# Patient Record
Sex: Female | Born: 1960 | Race: White | Hispanic: No | Marital: Married | Smoking: Never smoker
Health system: Southern US, Community
[De-identification: ages and names within clinical notes are randomized; demographics above are authoritative.]

## PROBLEM LIST (undated history)

## (undated) DIAGNOSIS — F419 Anxiety disorder, unspecified: Secondary | ICD-10-CM

## (undated) DIAGNOSIS — C439 Malignant melanoma of skin, unspecified: Secondary | ICD-10-CM

## (undated) DIAGNOSIS — R011 Cardiac murmur, unspecified: Secondary | ICD-10-CM

## (undated) DIAGNOSIS — D649 Anemia, unspecified: Secondary | ICD-10-CM

## (undated) DIAGNOSIS — O139 Gestational [pregnancy-induced] hypertension without significant proteinuria, unspecified trimester: Secondary | ICD-10-CM

## (undated) DIAGNOSIS — T7840XA Allergy, unspecified, initial encounter: Secondary | ICD-10-CM

## (undated) DIAGNOSIS — K219 Gastro-esophageal reflux disease without esophagitis: Secondary | ICD-10-CM

## (undated) DIAGNOSIS — K5790 Diverticulosis of intestine, part unspecified, without perforation or abscess without bleeding: Secondary | ICD-10-CM

## (undated) DIAGNOSIS — E785 Hyperlipidemia, unspecified: Secondary | ICD-10-CM

## (undated) HISTORY — DX: Allergy, unspecified, initial encounter: T78.40XA

## (undated) HISTORY — DX: Anxiety disorder, unspecified: F41.9

## (undated) HISTORY — DX: Hyperlipidemia, unspecified: E78.5

## (undated) HISTORY — DX: Anemia, unspecified: D64.9

## (undated) HISTORY — PX: COLONOSCOPY: SHX174

## (undated) HISTORY — PX: OTHER SURGICAL HISTORY: SHX169

## (undated) HISTORY — DX: Malignant melanoma of skin, unspecified: C43.9

## (undated) HISTORY — DX: Diverticulosis of intestine, part unspecified, without perforation or abscess without bleeding: K57.90

## (undated) HISTORY — PX: CHOLECYSTECTOMY: SHX55

## (undated) HISTORY — DX: Cardiac murmur, unspecified: R01.1

## (undated) HISTORY — DX: Gastro-esophageal reflux disease without esophagitis: K21.9

## (undated) HISTORY — DX: Gestational (pregnancy-induced) hypertension without significant proteinuria, unspecified trimester: O13.9

---

## 2001-02-18 ENCOUNTER — Other Ambulatory Visit: Admission: RE | Admit: 2001-02-18 | Discharge: 2001-02-18 | Payer: Self-pay | Admitting: Obstetrics and Gynecology

## 2001-12-12 ENCOUNTER — Encounter: Admission: RE | Admit: 2001-12-12 | Discharge: 2001-12-12 | Payer: Self-pay | Admitting: Internal Medicine

## 2001-12-12 ENCOUNTER — Encounter: Payer: Self-pay | Admitting: Internal Medicine

## 2003-01-25 ENCOUNTER — Encounter: Payer: Self-pay | Admitting: Internal Medicine

## 2003-01-25 ENCOUNTER — Encounter: Admission: RE | Admit: 2003-01-25 | Discharge: 2003-01-25 | Payer: Self-pay | Admitting: Internal Medicine

## 2004-05-05 ENCOUNTER — Encounter: Admission: RE | Admit: 2004-05-05 | Discharge: 2004-05-05 | Payer: Self-pay | Admitting: Internal Medicine

## 2004-06-16 ENCOUNTER — Ambulatory Visit: Payer: Self-pay | Admitting: Internal Medicine

## 2005-09-07 ENCOUNTER — Encounter: Admission: RE | Admit: 2005-09-07 | Discharge: 2005-09-07 | Payer: Self-pay | Admitting: Internal Medicine

## 2005-09-14 ENCOUNTER — Ambulatory Visit: Payer: Self-pay | Admitting: Internal Medicine

## 2006-09-13 ENCOUNTER — Encounter: Admission: RE | Admit: 2006-09-13 | Discharge: 2006-09-13 | Payer: Self-pay | Admitting: Internal Medicine

## 2006-09-25 ENCOUNTER — Encounter: Admission: RE | Admit: 2006-09-25 | Discharge: 2006-09-25 | Payer: Self-pay | Admitting: Internal Medicine

## 2006-12-09 DIAGNOSIS — R011 Cardiac murmur, unspecified: Secondary | ICD-10-CM

## 2006-12-09 DIAGNOSIS — I10 Essential (primary) hypertension: Secondary | ICD-10-CM

## 2006-12-09 DIAGNOSIS — G5600 Carpal tunnel syndrome, unspecified upper limb: Secondary | ICD-10-CM

## 2007-03-21 ENCOUNTER — Other Ambulatory Visit: Admission: RE | Admit: 2007-03-21 | Discharge: 2007-03-21 | Payer: Self-pay | Admitting: Family Medicine

## 2007-03-21 ENCOUNTER — Encounter: Payer: Self-pay | Admitting: Family Medicine

## 2007-03-21 ENCOUNTER — Ambulatory Visit: Payer: Self-pay | Admitting: Family Medicine

## 2007-03-21 DIAGNOSIS — R82998 Other abnormal findings in urine: Secondary | ICD-10-CM

## 2007-03-21 DIAGNOSIS — C4359 Malignant melanoma of other part of trunk: Secondary | ICD-10-CM

## 2007-03-21 DIAGNOSIS — N943 Premenstrual tension syndrome: Secondary | ICD-10-CM | POA: Insufficient documentation

## 2007-03-21 LAB — CONVERTED CEMR LAB: Pap Smear: NORMAL

## 2007-03-22 ENCOUNTER — Encounter: Payer: Self-pay | Admitting: Family Medicine

## 2007-03-25 ENCOUNTER — Encounter (INDEPENDENT_AMBULATORY_CARE_PROVIDER_SITE_OTHER): Payer: Self-pay | Admitting: *Deleted

## 2007-03-26 ENCOUNTER — Encounter (INDEPENDENT_AMBULATORY_CARE_PROVIDER_SITE_OTHER): Payer: Self-pay | Admitting: *Deleted

## 2007-04-02 ENCOUNTER — Telehealth (INDEPENDENT_AMBULATORY_CARE_PROVIDER_SITE_OTHER): Payer: Self-pay | Admitting: *Deleted

## 2007-04-02 DIAGNOSIS — D239 Other benign neoplasm of skin, unspecified: Secondary | ICD-10-CM | POA: Insufficient documentation

## 2007-04-08 LAB — CONVERTED CEMR LAB: Pap Smear: NORMAL

## 2007-07-18 ENCOUNTER — Ambulatory Visit: Payer: Self-pay | Admitting: Family Medicine

## 2007-07-21 LAB — CONVERTED CEMR LAB
ALT: 18 units/L (ref 0–35)
AST: 18 units/L (ref 0–37)
Albumin: 3.9 g/dL (ref 3.5–5.2)
Alkaline Phosphatase: 53 units/L (ref 39–117)
Bilirubin, Direct: 0.1 mg/dL (ref 0.0–0.3)
Cholesterol: 222 mg/dL (ref 0–200)
Direct LDL: 161.7 mg/dL
HDL: 48.7 mg/dL (ref >39.0)
Total Bilirubin: 0.7 mg/dL (ref 0.3–1.2)
Total CHOL/HDL Ratio: 4.6
Total Protein: 6.8 g/dL (ref 6.0–8.3)
Triglycerides: 65 mg/dL (ref 0–149)
VLDL: 13 mg/dL (ref 0–40)

## 2007-08-08 ENCOUNTER — Telehealth (INDEPENDENT_AMBULATORY_CARE_PROVIDER_SITE_OTHER): Payer: Self-pay | Admitting: *Deleted

## 2007-09-16 ENCOUNTER — Encounter: Admission: RE | Admit: 2007-09-16 | Discharge: 2007-09-16 | Payer: Self-pay | Admitting: Family Medicine

## 2007-09-22 ENCOUNTER — Encounter (INDEPENDENT_AMBULATORY_CARE_PROVIDER_SITE_OTHER): Payer: Self-pay | Admitting: *Deleted

## 2007-10-10 ENCOUNTER — Ambulatory Visit: Payer: Self-pay | Admitting: Family Medicine

## 2007-10-10 LAB — CONVERTED CEMR LAB
ALT: 15 units/L (ref 0–35)
AST: 16 units/L (ref 0–37)
Albumin: 3.9 g/dL (ref 3.5–5.2)
Alkaline Phosphatase: 47 units/L (ref 39–117)
Bilirubin, Direct: 0.1 mg/dL (ref 0.0–0.3)
Cholesterol: 136 mg/dL (ref 0–200)
HDL: 43.7 mg/dL (ref >39.0)
LDL Cholesterol: 82 mg/dL (ref 0–99)
Total Bilirubin: 0.8 mg/dL (ref 0.3–1.2)
Total CHOL/HDL Ratio: 3.1
Total Protein: 7 g/dL (ref 6.0–8.3)
Triglycerides: 51 mg/dL (ref 0–149)
VLDL: 10 mg/dL (ref 0–40)

## 2007-10-14 ENCOUNTER — Encounter (INDEPENDENT_AMBULATORY_CARE_PROVIDER_SITE_OTHER): Payer: Self-pay | Admitting: *Deleted

## 2008-02-04 ENCOUNTER — Ambulatory Visit: Payer: Self-pay | Admitting: *Deleted

## 2008-02-04 DIAGNOSIS — M79609 Pain in unspecified limb: Secondary | ICD-10-CM

## 2008-02-04 DIAGNOSIS — Z85820 Personal history of malignant melanoma of skin: Secondary | ICD-10-CM

## 2008-02-25 ENCOUNTER — Telehealth: Payer: Self-pay | Admitting: Family Medicine

## 2008-03-24 ENCOUNTER — Ambulatory Visit: Payer: Self-pay | Admitting: Family Medicine

## 2008-03-24 DIAGNOSIS — R109 Unspecified abdominal pain: Secondary | ICD-10-CM

## 2008-03-25 ENCOUNTER — Encounter (INDEPENDENT_AMBULATORY_CARE_PROVIDER_SITE_OTHER): Payer: Self-pay | Admitting: *Deleted

## 2008-03-26 ENCOUNTER — Encounter: Admission: RE | Admit: 2008-03-26 | Discharge: 2008-03-26 | Payer: Self-pay | Admitting: Family Medicine

## 2008-03-26 DIAGNOSIS — Z8719 Personal history of other diseases of the digestive system: Secondary | ICD-10-CM

## 2008-03-29 ENCOUNTER — Telehealth: Payer: Self-pay | Admitting: Family Medicine

## 2008-03-29 ENCOUNTER — Telehealth (INDEPENDENT_AMBULATORY_CARE_PROVIDER_SITE_OTHER): Payer: Self-pay | Admitting: *Deleted

## 2008-03-29 ENCOUNTER — Encounter (INDEPENDENT_AMBULATORY_CARE_PROVIDER_SITE_OTHER): Payer: Self-pay | Admitting: *Deleted

## 2008-04-30 ENCOUNTER — Ambulatory Visit: Payer: Self-pay | Admitting: Family Medicine

## 2008-04-30 DIAGNOSIS — H1030 Unspecified acute conjunctivitis, unspecified eye: Secondary | ICD-10-CM | POA: Insufficient documentation

## 2008-04-30 DIAGNOSIS — S058X9A Other injuries of unspecified eye and orbit, initial encounter: Secondary | ICD-10-CM | POA: Insufficient documentation

## 2008-05-10 ENCOUNTER — Encounter: Payer: Self-pay | Admitting: Family Medicine

## 2008-05-11 ENCOUNTER — Ambulatory Visit: Payer: Self-pay | Admitting: Family Medicine

## 2008-05-11 ENCOUNTER — Encounter: Payer: Self-pay | Admitting: Family Medicine

## 2008-05-11 ENCOUNTER — Other Ambulatory Visit: Admission: RE | Admit: 2008-05-11 | Discharge: 2008-05-11 | Payer: Self-pay | Admitting: Family Medicine

## 2008-05-18 ENCOUNTER — Encounter (INDEPENDENT_AMBULATORY_CARE_PROVIDER_SITE_OTHER): Payer: Self-pay | Admitting: *Deleted

## 2008-05-24 ENCOUNTER — Ambulatory Visit: Payer: Self-pay | Admitting: Family Medicine

## 2008-05-24 ENCOUNTER — Encounter (INDEPENDENT_AMBULATORY_CARE_PROVIDER_SITE_OTHER): Payer: Self-pay | Admitting: *Deleted

## 2008-05-24 LAB — CONVERTED CEMR LAB
OCCULT 1: NEGATIVE
OCCULT 2: NEGATIVE
OCCULT 3: NEGATIVE

## 2008-05-28 ENCOUNTER — Ambulatory Visit: Payer: Self-pay | Admitting: Family Medicine

## 2008-06-09 ENCOUNTER — Encounter (INDEPENDENT_AMBULATORY_CARE_PROVIDER_SITE_OTHER): Payer: Self-pay | Admitting: Surgery

## 2008-06-09 ENCOUNTER — Ambulatory Visit (HOSPITAL_COMMUNITY): Admission: RE | Admit: 2008-06-09 | Discharge: 2008-06-09 | Payer: Self-pay | Admitting: Surgery

## 2008-06-19 LAB — CONVERTED CEMR LAB
ALT: 18 units/L (ref 0–35)
AST: 24 units/L (ref 0–37)
Albumin: 3.9 g/dL (ref 3.5–5.2)
Alkaline Phosphatase: 55 units/L (ref 39–117)
BUN: 14 mg/dL (ref 6–23)
Basophils Absolute: 0.1 10*3/uL (ref 0.0–0.1)
Basophils Relative: 1.1 % (ref 0.0–3.0)
Bilirubin, Direct: 0.1 mg/dL (ref 0.0–0.3)
CO2: 29 meq/L (ref 19–32)
Calcium: 9 mg/dL (ref 8.4–10.5)
Chloride: 105 meq/L (ref 96–112)
Cholesterol: 186 mg/dL (ref 0–200)
Creatinine, Ser: 0.9 mg/dL (ref 0.4–1.2)
Eosinophils Absolute: 0.5 10*3/uL (ref 0.0–0.7)
Eosinophils Relative: 9.4 % — ABNORMAL HIGH (ref 0.0–5.0)
GFR calc Af Amer: 86 mL/min
GFR calc non Af Amer: 71 mL/min
Glucose, Bld: 75 mg/dL (ref 70–99)
HCT: 37.9 % (ref 36.0–46.0)
HDL: 47 mg/dL (ref >39.0)
Hemoglobin: 13.2 g/dL (ref 12.0–15.0)
LDL Cholesterol: 119 mg/dL — ABNORMAL HIGH (ref 0–99)
Lymphocytes Relative: 35.7 % (ref 12.0–46.0)
MCHC: 34.8 g/dL (ref 30.0–36.0)
MCV: 93.2 fL (ref 78.0–100.0)
Monocytes Absolute: 0.4 10*3/uL (ref 0.1–1.0)
Monocytes Relative: 7.3 % (ref 3.0–12.0)
Neutro Abs: 2.5 10*3/uL (ref 1.4–7.7)
Neutrophils Relative %: 46.5 % (ref 43.0–77.0)
Platelets: 235 10*3/uL (ref 150–400)
Potassium: 4.1 meq/L (ref 3.5–5.1)
RBC: 4.07 M/uL (ref 3.87–5.11)
RDW: 12.6 % (ref 11.5–14.6)
Sodium: 139 meq/L (ref 135–145)
TSH: 1.64 microintl units/mL (ref 0.35–5.50)
Total Bilirubin: 0.8 mg/dL (ref 0.3–1.2)
Total CHOL/HDL Ratio: 4
Total Protein: 6.9 g/dL (ref 6.0–8.3)
Triglycerides: 101 mg/dL (ref 0–149)
VLDL: 20 mg/dL (ref 0–40)
WBC: 5.5 10*3/uL (ref 4.5–10.5)

## 2008-06-22 ENCOUNTER — Telehealth (INDEPENDENT_AMBULATORY_CARE_PROVIDER_SITE_OTHER): Payer: Self-pay | Admitting: *Deleted

## 2008-09-16 ENCOUNTER — Encounter: Admission: RE | Admit: 2008-09-16 | Discharge: 2008-09-16 | Payer: Self-pay | Admitting: Family Medicine

## 2008-09-20 ENCOUNTER — Telehealth (INDEPENDENT_AMBULATORY_CARE_PROVIDER_SITE_OTHER): Payer: Self-pay | Admitting: *Deleted

## 2008-09-27 ENCOUNTER — Telehealth: Payer: Self-pay | Admitting: Family Medicine

## 2008-10-05 ENCOUNTER — Telehealth (INDEPENDENT_AMBULATORY_CARE_PROVIDER_SITE_OTHER): Payer: Self-pay | Admitting: *Deleted

## 2008-10-07 ENCOUNTER — Encounter: Payer: Self-pay | Admitting: Family Medicine

## 2009-05-20 ENCOUNTER — Ambulatory Visit: Payer: Self-pay | Admitting: Family Medicine

## 2009-05-20 ENCOUNTER — Other Ambulatory Visit: Admission: RE | Admit: 2009-05-20 | Discharge: 2009-05-20 | Payer: Self-pay | Admitting: Family Medicine

## 2009-05-20 ENCOUNTER — Encounter: Payer: Self-pay | Admitting: Family Medicine

## 2009-05-20 DIAGNOSIS — F411 Generalized anxiety disorder: Secondary | ICD-10-CM

## 2009-05-27 ENCOUNTER — Encounter: Payer: Self-pay | Admitting: Family Medicine

## 2009-06-01 ENCOUNTER — Telehealth (INDEPENDENT_AMBULATORY_CARE_PROVIDER_SITE_OTHER): Payer: Self-pay | Admitting: *Deleted

## 2009-09-19 ENCOUNTER — Encounter: Admission: RE | Admit: 2009-09-19 | Discharge: 2009-09-19 | Payer: Self-pay | Admitting: Family Medicine

## 2009-11-08 ENCOUNTER — Telehealth: Payer: Self-pay | Admitting: Family Medicine

## 2010-02-14 ENCOUNTER — Ambulatory Visit: Payer: Self-pay | Admitting: Family Medicine

## 2010-03-07 ENCOUNTER — Telehealth: Payer: Self-pay | Admitting: Family Medicine

## 2010-03-14 ENCOUNTER — Telehealth (INDEPENDENT_AMBULATORY_CARE_PROVIDER_SITE_OTHER): Payer: Self-pay | Admitting: *Deleted

## 2010-05-19 ENCOUNTER — Ambulatory Visit: Payer: Self-pay | Admitting: Family Medicine

## 2010-05-23 ENCOUNTER — Other Ambulatory Visit: Admission: RE | Admit: 2010-05-23 | Discharge: 2010-05-23 | Payer: Self-pay | Admitting: Family Medicine

## 2010-05-23 ENCOUNTER — Ambulatory Visit: Payer: Self-pay | Admitting: Family Medicine

## 2010-05-23 ENCOUNTER — Encounter: Payer: Self-pay | Admitting: Family Medicine

## 2010-05-23 DIAGNOSIS — E785 Hyperlipidemia, unspecified: Secondary | ICD-10-CM | POA: Insufficient documentation

## 2010-05-26 LAB — CONVERTED CEMR LAB: Pap Smear: NEGATIVE

## 2010-06-06 ENCOUNTER — Telehealth (INDEPENDENT_AMBULATORY_CARE_PROVIDER_SITE_OTHER): Payer: Self-pay | Admitting: *Deleted

## 2010-08-06 LAB — CONVERTED CEMR LAB
ALT: 15 units/L (ref 0–35)
ALT: 20 units/L (ref 0–35)
ALT: 28 units/L (ref 0–35)
ALT: 42 units/L — ABNORMAL HIGH (ref 0–35)
AST: 16 units/L (ref 0–37)
AST: 22 units/L (ref 0–37)
AST: 27 units/L (ref 0–37)
AST: 71 units/L — ABNORMAL HIGH (ref 0–37)
Albumin: 3.9 g/dL (ref 3.5–5.2)
Albumin: 4.1 g/dL (ref 3.5–5.2)
Albumin: 4.2 g/dL (ref 3.5–5.2)
Albumin: 4.5 g/dL (ref 3.5–5.2)
Alkaline Phosphatase: 52 units/L (ref 39–117)
Alkaline Phosphatase: 52 units/L (ref 39–117)
Alkaline Phosphatase: 60 units/L (ref 39–117)
Alkaline Phosphatase: 77 units/L (ref 39–117)
BUN: 11 mg/dL (ref 6–23)
BUN: 11 mg/dL (ref 6–23)
BUN: 13 mg/dL (ref 6–23)
BUN: 17 mg/dL (ref 6–23)
Basophils Absolute: 0 10*3/uL (ref 0.0–0.1)
Basophils Absolute: 0 10*3/uL (ref 0.0–0.1)
Basophils Absolute: 0 10*3/uL (ref 0.0–0.1)
Basophils Absolute: 0 10*3/uL (ref 0.0–0.1)
Basophils Relative: 0.2 % (ref 0.0–1.0)
Basophils Relative: 0.2 % (ref 0.0–3.0)
Basophils Relative: 0.6 % (ref 0.0–3.0)
Basophils Relative: 0.7 % (ref 0.0–3.0)
Bilirubin Urine: NEGATIVE
Bilirubin Urine: NEGATIVE
Bilirubin Urine: NEGATIVE
Bilirubin, Direct: 0 mg/dL (ref 0.0–0.3)
Bilirubin, Direct: 0.1 mg/dL (ref 0.0–0.3)
Bilirubin, Direct: 0.1 mg/dL (ref 0.0–0.3)
Bilirubin, Direct: 0.1 mg/dL (ref 0.0–0.3)
Blood in Urine, dipstick: NEGATIVE
Blood in Urine, dipstick: NEGATIVE
CO2: 27 meq/L (ref 19–32)
CO2: 28 meq/L (ref 19–32)
CO2: 28 meq/L (ref 19–32)
CO2: 28 meq/L (ref 19–32)
Calcium: 8.8 mg/dL (ref 8.4–10.5)
Calcium: 9.1 mg/dL (ref 8.4–10.5)
Calcium: 9.3 mg/dL (ref 8.4–10.5)
Calcium: 9.5 mg/dL (ref 8.4–10.5)
Chloride: 104 meq/L (ref 96–112)
Chloride: 105 meq/L (ref 96–112)
Chloride: 109 meq/L (ref 96–112)
Chloride: 110 meq/L (ref 96–112)
Cholesterol: 214 mg/dL (ref 0–200)
Cholesterol: 230 mg/dL — ABNORMAL HIGH (ref 0–200)
Creatinine, Ser: 0.8 mg/dL (ref 0.4–1.2)
Creatinine, Ser: 0.9 mg/dL (ref 0.4–1.2)
Creatinine, Ser: 0.9 mg/dL (ref 0.4–1.2)
Creatinine, Ser: 1 mg/dL (ref 0.4–1.2)
Direct LDL: 160.6 mg/dL
Direct LDL: 169.7 mg/dL
Eosinophils Absolute: 0.1 10*3/uL (ref 0.0–0.7)
Eosinophils Absolute: 0.2 10*3/uL (ref 0.0–0.7)
Eosinophils Absolute: 0.2 10*3/uL (ref 0.0–0.7)
Eosinophils Absolute: 0.3 10*3/uL (ref 0.0–0.6)
Eosinophils Relative: 1.3 % (ref 0.0–5.0)
Eosinophils Relative: 3.2 % (ref 0.0–5.0)
Eosinophils Relative: 4.4 % (ref 0.0–5.0)
Eosinophils Relative: 5 % (ref 0.0–5.0)
GFR calc Af Amer: 86 mL/min
GFR calc Af Amer: 99 mL/min
GFR calc non Af Amer: 62.77 mL/min (ref >60)
GFR calc non Af Amer: 71 mL/min
GFR calc non Af Amer: 72.45 mL/min (ref >60)
GFR calc non Af Amer: 82 mL/min
Glucose, Bld: 81 mg/dL (ref 70–99)
Glucose, Bld: 89 mg/dL (ref 70–99)
Glucose, Bld: 89 mg/dL (ref 70–99)
Glucose, Bld: 90 mg/dL (ref 70–99)
Glucose, Urine, Semiquant: NEGATIVE
Glucose, Urine, Semiquant: NEGATIVE
Glucose, Urine, Semiquant: NEGATIVE
HCT: 35.7 % — ABNORMAL LOW (ref 36.0–46.0)
HCT: 37.5 % (ref 36.0–46.0)
HCT: 38.2 % (ref 36.0–46.0)
HCT: 38.3 % (ref 36.0–46.0)
HDL: 43.4 mg/dL (ref >39.0)
HDL: 49.7 mg/dL (ref >39.00)
Hemoglobin: 12.5 g/dL (ref 12.0–15.0)
Hemoglobin: 12.8 g/dL (ref 12.0–15.0)
Hemoglobin: 13.2 g/dL (ref 12.0–15.0)
Hemoglobin: 13.4 g/dL (ref 12.0–15.0)
Hgb A1c MFr Bld: 5.7 % (ref 4.6–6.0)
Ketones, urine, test strip: NEGATIVE
Ketones, urine, test strip: NEGATIVE
Ketones, urine, test strip: NEGATIVE
Lymphocytes Relative: 13.1 % (ref 12.0–46.0)
Lymphocytes Relative: 29.8 % (ref 12.0–46.0)
Lymphocytes Relative: 34.1 % (ref 12.0–46.0)
Lymphocytes Relative: 36.3 % (ref 12.0–46.0)
Lymphs Abs: 1.6 10*3/uL (ref 0.7–4.0)
Lymphs Abs: 1.8 10*3/uL (ref 0.7–4.0)
MCHC: 34 g/dL (ref 30.0–36.0)
MCHC: 34.5 g/dL (ref 30.0–36.0)
MCHC: 35.1 g/dL (ref 30.0–36.0)
MCHC: 35.1 g/dL (ref 30.0–36.0)
MCV: 91.9 fL (ref 78.0–100.0)
MCV: 93.2 fL (ref 78.0–100.0)
MCV: 94.2 fL (ref 78.0–100.0)
MCV: 94.4 fL (ref 78.0–100.0)
Monocytes Absolute: 0.3 10*3/uL (ref 0.1–1.0)
Monocytes Absolute: 0.4 10*3/uL (ref 0.1–1.0)
Monocytes Absolute: 0.4 10*3/uL (ref 0.2–0.7)
Monocytes Absolute: 0.5 10*3/uL (ref 0.1–1.0)
Monocytes Relative: 5.2 % (ref 3.0–12.0)
Monocytes Relative: 5.8 % (ref 3.0–12.0)
Monocytes Relative: 7.3 % (ref 3.0–11.0)
Monocytes Relative: 9 % (ref 3.0–12.0)
Neutro Abs: 2.2 10*3/uL (ref 1.4–7.7)
Neutro Abs: 3 10*3/uL (ref 1.4–7.7)
Neutro Abs: 3.5 10*3/uL (ref 1.4–7.7)
Neutro Abs: 8.4 10*3/uL — ABNORMAL HIGH (ref 1.4–7.7)
Neutrophils Relative %: 49 % (ref 43.0–77.0)
Neutrophils Relative %: 56.3 % (ref 43.0–77.0)
Neutrophils Relative %: 58.3 % (ref 43.0–77.0)
Neutrophils Relative %: 80.2 % — ABNORMAL HIGH (ref 43.0–77.0)
Nitrite: NEGATIVE
Nitrite: NEGATIVE
Nitrite: NEGATIVE
Phosphorus: 3.1 mg/dL (ref 2.3–4.6)
Platelets: 270 10*3/uL (ref 150–400)
Platelets: 278 10*3/uL (ref 150–400)
Platelets: 288 10*3/uL (ref 150.0–400.0)
Platelets: 289 10*3/uL (ref 150.0–400.0)
Potassium: 3.7 meq/L (ref 3.5–5.1)
Potassium: 4 meq/L (ref 3.5–5.1)
Potassium: 4.1 meq/L (ref 3.5–5.1)
Potassium: 4.8 meq/L (ref 3.5–5.1)
Protein, U semiquant: NEGATIVE
Protein, U semiquant: NEGATIVE
Protein, U semiquant: NEGATIVE
RBC: 3.89 M/uL (ref 3.87–5.11)
RBC: 4.02 M/uL (ref 3.87–5.11)
RBC: 4.06 M/uL (ref 3.87–5.11)
RBC: 4.06 M/uL (ref 3.87–5.11)
RDW: 12.2 % (ref 11.5–14.6)
RDW: 12.7 % (ref 11.5–14.6)
RDW: 12.7 % (ref 11.5–14.6)
RDW: 13.8 % (ref 11.5–14.6)
Sodium: 137 meq/L (ref 135–145)
Sodium: 140 meq/L (ref 135–145)
Sodium: 141 meq/L (ref 135–145)
Sodium: 141 meq/L (ref 135–145)
Specific Gravity, Urine: 1.01
Specific Gravity, Urine: <1.005
Specific Gravity, Urine: <1.005
TSH: 1.22 microintl units/mL (ref 0.35–5.50)
TSH: 1.47 microintl units/mL (ref 0.35–5.50)
TSH: 1.57 microintl units/mL (ref 0.35–5.50)
TSH: 1.84 microintl units/mL (ref 0.35–5.50)
Total Bilirubin: 0.5 mg/dL (ref 0.3–1.2)
Total Bilirubin: 0.7 mg/dL (ref 0.3–1.2)
Total Bilirubin: 0.8 mg/dL (ref 0.3–1.2)
Total Bilirubin: 0.9 mg/dL (ref 0.3–1.2)
Total CHOL/HDL Ratio: 4.9
Total CHOL/HDL Ratio: 5
Total Protein: 6.8 g/dL (ref 6.0–8.3)
Total Protein: 7 g/dL (ref 6.0–8.3)
Total Protein: 7.2 g/dL (ref 6.0–8.3)
Total Protein: 7.2 g/dL (ref 6.0–8.3)
Triglycerides: 58 mg/dL (ref 0–149)
Triglycerides: 61 mg/dL (ref 0.0–149.0)
Urobilinogen, UA: 0.2
Urobilinogen, UA: NEGATIVE
Urobilinogen, UA: NEGATIVE
VLDL: 12 mg/dL (ref 0–40)
VLDL: 12.2 mg/dL (ref 0.0–40.0)
WBC Urine, dipstick: NEGATIVE
WBC Urine, dipstick: NEGATIVE
WBC: 10.4 10*3/uL (ref 4.5–10.5)
WBC: 4.5 10*3/uL (ref 4.5–10.5)
WBC: 5.3 10*3/uL (ref 4.5–10.5)
WBC: 6 10*3/uL (ref 4.5–10.5)
pH: 5
pH: 5.5
pH: 6

## 2010-08-10 NOTE — Assessment & Plan Note (Signed)
Summary: CPX/NTA   Vital Signs:  Patient profile:   50 year old female Height:      60.75 inches Weight:      132.0 pounds BMI:     25.24 Temp:     97.3 degrees F oral Pulse rate:   72 / minute Pulse rhythm:   regular BP sitting:   120 / 80  (right arm) Cuff size:   regular  Vitals Entered By: Almeta Monas CMA Duncan Dull) (May 23, 2010 2:15 PM) CC: CPX-- Pt never started Zocor   History of Present Illness: Pt here for cpe , and pap---pt had labs last week.    No complaints.  Pt never took zocor--- she filled rx but never took pills.     Preventive Screening-Counseling & Management  Alcohol-Tobacco     Alcohol drinks/day: <1     Smoking Status: never     Passive Smoke Exposure: no  Caffeine-Diet-Exercise     Caffeine use/day: 3     Does Patient Exercise: yes     Type of exercise: walk     Exercise (avg: min/session): 30-60     Times/week: 4     Exercise Counseling: to improve exercise regimen  Hep-HIV-STD-Contraception     HIV Risk: no     Dental Visit-last 6 months yes     Dental Care Counseling: not indicated; dental care within six months     SBE monthly: yes     SBE Education/Counseling: not indicated; SBE done regularly     Sun Exposure-Excessive: occasionally  Safety-Violence-Falls     Seat Belt Use: yes      Sexual History:  currently monogamous.        Drug Use:  no.    Current Medications (verified): 1)  Lorazepam 1 Mg  Tabs (Lorazepam) .Marland Kitchen.. 1 By Mouth Once Daily As Needed 2)  Claritin-D 12 Hour 5-120 Mg  Tb12 (Loratadine-Pseudoephedrine) .Marland Kitchen.. 1 By Mouth Once Daily 3)  Relpax 40 Mg  Tabs (Eletriptan Hydrobromide) .... As Needed Migraines  Allergies (verified): No Known Drug Allergies  Past History:  Past Medical History: Last updated: 02/04/2008 gestational htn melanoma-- back asthma as a child seasonal allergies heart murmur hyperlipidemia  Past Surgical History: Last updated: 05/11/2008 skin surgery for moles, melanoma nasal  polyps  Family History: Last updated: 12/09/2006 Family History Diabetes 1st degree relative Family History Hypertension Family History Lung cancer Family History Other cancer-Brain Family History MI Family History Stroke M Great Aunt- form of Lou Gerig's dz  Social History: Last updated: 02/04/2008 Never Smoked Alcohol use-no Occupation: church Manufacturing systems engineer Married 1 child Drug use-no Regular exercise-yes  Risk Factors: Alcohol Use: <1 (05/23/2010) Caffeine Use: 3 (05/23/2010) Exercise: yes (05/23/2010)  Risk Factors: Smoking Status: never (05/23/2010) Passive Smoke Exposure: no (05/23/2010)  Family History: Reviewed history from 12/09/2006 and no changes required. Family History Diabetes 1st degree relative Family History Hypertension Family History Lung cancer Family History Other cancer-Brain Family History MI Family History Stroke M Great Aunt- form of Nilsa Nutting dz  Social History: Reviewed history from 02/04/2008 and no changes required. 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 Eyes:  Denies blurring, discharge, double vision, eye irritation, eye pain, halos, itching, light sensitivity, red eye, vision loss-1 eye, and vision loss-both eyes;  optho q1 1/2 y. ENT:  Denies decreased hearing, difficulty swallowing, ear discharge, earache, hoarseness, nasal congestion, nosebleeds, postnasal drainage, ringing in ears, sinus pressure, and sore throat. CV:  Denies bluish discoloration of lips or nails, chest pain or discomfort, difficulty breathing at night, difficulty breathing while lying down, fainting, fatigue, leg cramps with exertion, lightheadness, near fainting, palpitations, shortness of  breath with exertion, swelling of feet, swelling of hands, and weight gain. Resp:  Denies chest discomfort, chest pain with inspiration, cough, coughing up blood, excessive snoring, hypersomnolence, morning headaches, pleuritic, shortness of breath, sputum productive, and wheezing. GI:  Denies abdominal pain, bloody stools, change in bowel habits, constipation, dark tarry stools, diarrhea, excessive appetite, gas, hemorrhoids, indigestion, loss of appetite, nausea, vomiting, vomiting blood, and yellowish skin color. GU:  Denies abnormal vaginal bleeding, decreased libido, discharge, dysuria, genital sores, hematuria, incontinence, nocturia, urinary frequency, and urinary hesitancy. MS:  Denies joint pain, joint redness, joint swelling, loss of strength, low back pain, mid back pain, muscle aches, muscle , cramps, muscle weakness, stiffness, and thoracic pain. Derm:  Denies changes in color of skin, changes in nail beds, dryness, excessive perspiration, flushing, hair loss, insect bite(s), itching, lesion(s), poor wound healing, and rash; derm --- [facility]. Neuro:  Denies brief paralysis, difficulty with concentration, disturbances in coordination, falling down, headaches, inability to speak, memory loss, numbness, poor balance, seizures, sensation of room spinning, tingling, tremors, visual disturbances, and weakness. Psych:  Denies alternate hallucination ( auditory/visual), anxiety, depression, easily angered, easily tearful, irritability, mental problems, panic attacks, sense of great danger, suicidal thoughts/plans, thoughts of violence, unusual visions or sounds, and thoughts /plans of harming others. Endo:  Denies cold intolerance, excessive hunger, excessive thirst, excessive urination, heat intolerance, polyuria, and weight change. Heme:  Denies abnormal bruising, bleeding, enlarge lymph nodes, fevers, pallor, and skin discoloration. Allergy:  Denies hives or rash, itching eyes, persistent  infections, seasonal allergies, and sneezing.  Physical Exam  General:  Well-developed,well-nourished,in no acute distress; alert,appropriate and cooperative throughout examination Head:  Normocephalic and atraumatic without obvious abnormalities. No apparent alopecia or balding. Eyes:  vision grossly intact, pupils equal, pupils round, pupils reactive to light, and no injection.   Ears:  External ear exam shows no significant lesions or deformities.  Otoscopic examination reveals clear canals, tympanic membranes are intact bilaterally without bulging, retraction, inflammation or discharge. Hearing is grossly normal bilaterally. Nose:  External nasal examination shows no deformity or inflammation. Nasal mucosa are pink and moist without lesions or exudates. Mouth:  Oral mucosa and oropharynx without lesions or exudates.  Teeth in good repair. Neck:  No deformities, masses, or tenderness noted.no carotid bruits.   Chest Wall:  No deformities, masses, or tenderness noted. Breasts:  No mass, nodules, thickening, tenderness, bulging, retraction, inflamation, nipple discharge or skin changes noted.   Lungs:  Normal respiratory effort, chest expands symmetrically. Lungs are clear to auscultation, no crackles or wheezes. Heart:  normal rate and no murmur.   Abdomen:  Bowel sounds positive,abdomen soft and non-tender without masses, organomegaly or hernias noted. Rectal:  No external abnormalities noted. Normal sphincter tone. No rectal masses or tenderness.  heme negative brown stool Genitalia:  Pelvic Exam:        External: normal female genitalia without lesions or masses        Vagina: normal without lesions or masses        Cervix: normal without lesions or masses        Adnexa: normal bimanual exam without masses or  fullness        Uterus: normal by palpation        Pap smear: performed Msk:  No deformity or scoliosis noted of thoracic or lumbar spine.   Pulses:  R posterior tibial normal, R  dorsalis pedis normal, R carotid normal, L posterior tibial normal, L dorsalis pedis normal, and L carotid normal.   Extremities:  No clubbing, cyanosis, edema, or deformity noted with normal full range of motion of all joints.   Neurologic:  No cranial nerve deficits noted. Station and gait are normal. Plantar reflexes are down-going bilaterally. DTRs are symmetrical throughout. Sensory, motor and coordinative functions appear intact. Skin:  Intact without suspicious lesions or rashes Cervical Nodes:  No lymphadenopathy noted Axillary Nodes:  No palpable lymphadenopathy Psych:  Cognition and judgment appear intact. Alert and cooperative with normal attention span and concentration. No apparent delusions, illusions, hallucinations   Impression & Recommendations:  Problem # 1:  PREVENTIVE HEALTH CARE (ICD-V70.0)  labs reviewed--lipid pending ghm utd  Orders: EKG w/ Interpretation (93000)  Problem # 2:  HYPERLIPIDEMIA (ICD-272.4)  The following medications were removed from the medication list:    Zocor 80 Mg Tabs (Simvastatin) .Marland Kitchen... Take 1 by mouth at bedtime  Labs Reviewed: SGOT: 27 (05/19/2010)   SGPT: 28 (05/19/2010)   HDL:49.70 (05/20/2009), 47.0 (05/28/2008)  LDL:119 (05/28/2008), 82 (16/04/9603)  Chol:230 (05/20/2009), 186 (05/28/2008)  Trig:61.0 (05/20/2009), 101 (05/28/2008)  Orders: EKG w/ Interpretation (93000)  Problem # 3:  MELANOMA, HX OF (ICD-V10.82)  Orders: EKG w/ Interpretation (93000)  Complete Medication List: 1)  Lorazepam 1 Mg Tabs (Lorazepam) .Marland Kitchen.. 1 by mouth once daily as needed 2)  Claritin-d 12 Hour 5-120 Mg Tb12 (Loratadine-pseudoephedrine) .Marland Kitchen.. 1 by mouth once daily 3)  Relpax 40 Mg Tabs (Eletriptan hydrobromide) .... As needed migraines  Patient Instructions: 1)  Please schedule a follow-up appointment in 6 months .    Orders Added: 1)  Est. Patient 40-64 years [99396] 2)  EKG w/ Interpretation [93000]    Last Flu Vaccine:  refused  (05/20/2009 8:27:30 AM) Flu Vaccine Next Due:  Refused Last Mammogram:  ASSESSMENT: Negative - BI-RADS 1^MM DIGITAL SCREENING (09/19/2009 2:37:00 PM) Mammogram Result Date:  09/19/2009 Mammogram Result:  normal Mammogram Next Due:  1 yr

## 2010-08-10 NOTE — Assessment & Plan Note (Signed)
Summary: TB TEST/KN  Nurse Visit   Allergies: No Known Drug Allergies  Immunizations Administered:  PPD Skin Test:    Vaccine Type: PPD    Site: right forearm    Mfr: Sanofi Pasteur    Dose: 0.1 ml    Route: ID    Given by: Jeremy Johann CMA    Exp. Date: 05/07/2011    Lot #: [id number]  PPD Results    Date of reading: 02/16/2010    Results: < 5mm    Interpretation: negative  Orders Added: 1)  TB Skin Test [86580] 2)  Admin 1st Vaccine [45409]

## 2010-08-10 NOTE — Progress Notes (Signed)
Summary: labs  Phone Note Outgoing Call   Call placed by: Doristine Devoid CMA,  June 06, 2010 3:19 PM Call placed to: Patient Summary of Call: LDL particles are high--- need to start medication to lower these----crestor 10 mg  #30  1 by mouth at bedtime, 2  refills----------recheck 3 months------272.4  NMR, hep   Follow-up for Phone Call        left message on machine .....Marland KitchenMarland KitchenDoristine Devoid CMA  June 06, 2010 3:20 PM   spoke w/ patient aware of labs and that medication is to be started .....Marland KitchenMarland KitchenDoristine Devoid CMA  June 12, 2010 4:30 PM     New/Updated Medications: CRESTOR 10 MG TABS (ROSUVASTATIN CALCIUM) take one tablet at bedtime Prescriptions: CRESTOR 10 MG TABS (ROSUVASTATIN CALCIUM) take one tablet at bedtime  #30 x 2   Entered by:   Doristine Devoid CMA   Authorized by:   Neena Rhymes MD   Signed by:   Doristine Devoid CMA on 06/12/2010   Method used:   Electronically to        [facility] # [phone]* (retail)       [address]       [place], [place]  [address]       Ph: [phone]       Fax: [phone]   RxID:   [id number]

## 2010-08-10 NOTE — Progress Notes (Signed)
Summary: RX REFILL  Phone Note Refill Request Message from:  Pharmacy on March 07, 2010 11:47 AM  Refills Requested: Medication #1:  LORAZEPAM 1 MG  TABS 1 by mouth once daily as needed   Dosage confirmed as above?Dosage Confirmed   Last Refilled: 07/26/2009 pt last OV 11/10 for CPX, Rx last filled 07/26/09.   Initial call taken by: Almeta Monas CMA Duncan Dull),  March 07, 2010 11:47 AM Caller: [facility] # [phone]* Details for Reason: RF request  Follow-up for Phone Call        refill x1 Follow-up by: Loreen Freud DO,  March 07, 2010 12:28 PM    Prescriptions: LORAZEPAM 1 MG  TABS (LORAZEPAM) 1 by mouth once daily as needed  #90 x 0   Entered by:   Jeremy Johann CMA   Authorized by:   Loreen Freud DO   Signed by:   Jeremy Johann CMA on 03/07/2010   Method used:   Printed then faxed to ...       [facility] # [phone]* (retail)       [address]       [place], [place]  [address]       Ph: [phone]       Fax: [phone]   RxID:   [id number]

## 2010-08-10 NOTE — Letter (Signed)
Summary: Physical Exam Form/[facility] [employer]  Physical Exam Form/[facility] [employer]   Imported By: Lanelle Bal 02/23/2010 10:52:28  _____________________________________________________________________  External Attachment:    Type:   Image     Comment:   External Document

## 2010-08-10 NOTE — Progress Notes (Signed)
Summary: Refill Request  Phone Note Refill Request Call back at [phone] Message from:  Pharmacy on Nov 08, 2009 9:58 AM  Refills Requested: Medication #1:  RELPAX 40 MG  TABS as needed MIGRAINES   Dosage confirmed as above?Dosage Confirmed   Supply Requested: 3 months [facility]  Next Appointment Scheduled: none Initial call taken by: Harold Barban,  Nov 08, 2009 9:58 AM  Follow-up for Phone Call        last filled 05/20/2009, last ov- 05/20/09. Army Fossa CMA  Nov 08, 2009 10:00 AM   Additional Follow-up for Phone Call Additional follow up Details #1::        ok to refill #30 ---that would be a 3 month supply Additional Follow-up by: Loreen Freud DO,  Nov 08, 2009 10:31 AM    Prescriptions: RELPAX 40 MG  TABS (ELETRIPTAN HYDROBROMIDE) as needed MIGRAINES  #36 x 0   Entered by:   Army Fossa CMA   Authorized by:   Loreen Freud DO   Signed by:   Army Fossa CMA on 11/08/2009   Method used:   Faxed to ...       [facility] (mail-order)       [address] [place], [place]  [address]       Ph: [phone]       Fax: [phone]   RxID:   [id number]

## 2010-08-10 NOTE — Progress Notes (Signed)
Summary: LORAZEPAM REFILL  Phone Note Refill Request Message from:  Fax from Pharmacy on March 14, 2010 3:03 PM  Refills Requested: Medication #1:  LORAZEPAM 1 MG  TABS 1 by mouth once daily as needed [facility] [place],   Valinda Hoar  [phone]     QTY = 90  Initial call taken by: Jerolyn Shin,  March 14, 2010 3:04 PM  Follow-up for Phone Call        spk with pharmacist and he said Rx is is ready and apologized for duplicate request. Pt aware Rx is ready. Almeta Monas CMA Duncan Dull)  March 14, 2010 3:39 PM

## 2010-10-03 ENCOUNTER — Other Ambulatory Visit: Payer: Self-pay | Admitting: Family Medicine

## 2010-10-03 MED ORDER — ELETRIPTAN HYDROBROMIDE 40 MG PO TABS: ORAL_TABLET | ORAL | Status: DC

## 2010-10-03 NOTE — Telephone Encounter (Signed)
last filled 05/20/2009 #36, last ov- 05/23/10.[name] CMA

## 2010-10-04 NOTE — Telephone Encounter (Signed)
Rx faxed [facility].[name] CMA

## 2010-11-12 ENCOUNTER — Other Ambulatory Visit: Payer: Self-pay | Admitting: Family Medicine

## 2010-11-14 ENCOUNTER — Telehealth: Payer: Self-pay | Admitting: Family Medicine

## 2010-11-14 ENCOUNTER — Other Ambulatory Visit: Payer: Self-pay | Admitting: *Deleted

## 2010-11-14 MED ORDER — LORAZEPAM 1 MG PO TABS: 1.0000 mg | ORAL_TABLET | Freq: Every day | ORAL | Status: DC | PRN

## 2010-11-14 MED ORDER — ELETRIPTAN HYDROBROMIDE 40 MG PO TABS: ORAL_TABLET | ORAL | Status: DC

## 2010-11-14 NOTE — Telephone Encounter (Signed)
Faxed.   KP 

## 2010-11-14 NOTE — Telephone Encounter (Signed)
Rx Faxed    KP 

## 2010-11-14 NOTE — Telephone Encounter (Signed)
patient needs refill for relpaz 40mg  - [facility] -6 boxes (6 to a box) total 36 tablets   - she said she called pharmacy in April but hasn't received med

## 2010-11-14 NOTE — Telephone Encounter (Signed)
Last OV 05-23-10, last filled 03-07-10 #90

## 2010-11-14 NOTE — Telephone Encounter (Signed)
Refill x1 

## 2010-11-15 NOTE — Telephone Encounter (Signed)
Rx faxed.    KP 

## 2010-11-17 ENCOUNTER — Other Ambulatory Visit: Payer: Self-pay | Admitting: Family Medicine

## 2010-11-21 NOTE — Op Note (Signed)
[name], [name]             ACCOUNT NO.:  [id number]   MEDICAL RECORD NO.:  [id number]          PATIENT TYPE:  AMB   LOCATION:  SDS                          FACILITY:  [facility]   PHYSICIAN:  Wilmon Arms. Corliss Skains, M.D. DATE OF BIRTH:  24-Jul-1960   DATE OF PROCEDURE:  06/09/2008  DATE OF DISCHARGE:                               OPERATIVE REPORT   PREOPERATIVE DIAGNOSIS:  Chronic cholecystitis.   POSTOPERATIVE DIAGNOSIS:  Chronic cholecystitis.   PROCEDURE PERFORMED:  Laparoscopic cholecystectomy with intraoperative  cholangiogram.   SURGEON:  Wilmon Arms. Corliss Skains, MD, FACS   ANESTHESIA:  General endotracheal.   INDICATIONS:  The patient is a 50 year old female who presents after  having a single severe episode of right upper quadrant abdominal pain  radiating around the right side.  Workup showed a number of small  gallstones associated with some gallbladder sludge.  Common bile duct  was normal.  Liver function tests were normal preoperatively.  She  presents now for elective cholecystectomy.   DESCRIPTION OF PROCEDURE:  The patient was brought to the operating room  and placed in the supine position on operating room table.  After an  adequate level of general anesthesia was obtained, the patient's abdomen  was prepped with Betadine and draped in sterile fashion.  A time-out was  taken to assure proper patient and proper procedure.  The area below  umbilicus was infiltrated with 0.25% Marcaine with epinephrine.  A  transverse incision was made below the umbilicus.  Dissection was  carried down to the fascia.  The fascia was opened vertically.  A stay  suture of 0 Vicryl was placed around the fascial opening.  We entered  the peritoneal cavity bluntly.  The Hasson cannula was inserted and  secured to stay suture.  Pneumoperitoneum was obtained by insufflating  CO2 maintaining maximum pressure of 15 mmHg.  The laparoscope was  inserted, and the patient was positioned in reverse  Trendelenburg tilted  to her left.  An 11-mm port was placed in subxiphoid position.  Two 5-mm  ports were placed in the right upper quadrant.  The edge of the liver  was lifted, and a diminutive gallbladder was identified.  This was  grasped with clamp and elevated.  We peeled away the adhesions to the  surface of the gallbladder.  We bluntly dissected the duodenum away from  the gallbladder.  I opened the peritoneum around the hilum of  gallbladder and bluntly dissected around the cystic duct.  A clip was  used to ligate the duct distally.  A small opening was created on the  cystic duct.  A Cook cholangiogram catheter was inserted through a stab  incision and threaded in the cystic duct.  A cholangiogram was then  obtained which showed no obvious filling defects and good flow of  contrast proximally and distally, biliary tree with contrast flowed into  the duodenum.  The catheter was removed and the cystic duct was ligated  with clips and divided.  The cystic artery was ligated with clips and  divided.  Cautery was then used to remove the gallbladder from the  liver  bed.  A tiny pinhole was inadvertently made in the gallbladder, small  amount of bile was leaked.  No stones were noted.  The gallbladder was  then placed in an EndoCatch sac and removed through umbilical port site.  We reinspected the gallbladder fossa for hemostasis which was good.  We  suctioned out as much irrigation as possible.  Pneumoperitoneum was then  released as we removed the trocars under direct vision.  The fascia was closed with the pursestring suture.  A 4-0 Monocryl was  used to close skin incisions.  Steri-Strips and clean dressings were  applied.  The patient was then extubated and brought to recovery room in  stable condition.  All sponge, instrument, and needle counts were  correct.      Wilmon Arms. Tsuei, M.D.  Electronically Signed     MKT/MEDQ  D:  06/09/2008  T:  06/09/2008  Job:  [id number]

## 2010-12-07 ENCOUNTER — Other Ambulatory Visit: Payer: Self-pay | Admitting: Family Medicine

## 2010-12-07 DIAGNOSIS — Z1231 Encounter for screening mammogram for malignant neoplasm of breast: Secondary | ICD-10-CM

## 2010-12-18 ENCOUNTER — Ambulatory Visit
Admission: RE | Admit: 2010-12-18 | Discharge: 2010-12-18 | Disposition: A | Discharge status: Home / Self Care | Payer: BC Managed Care – PPO | Source: Ambulatory Visit | Attending: Family Medicine | Admitting: Family Medicine

## 2010-12-18 DIAGNOSIS — Z1231 Encounter for screening mammogram for malignant neoplasm of breast: Secondary | ICD-10-CM

## 2011-04-11 LAB — COMPREHENSIVE METABOLIC PANEL
Albumin: 4.2
Alkaline Phosphatase: 65
BUN: 13
Creatinine, Ser: 0.91
Glucose, Bld: 96
Potassium: 4.6
Total Protein: 6.9

## 2011-04-11 LAB — DIFFERENTIAL
Basophils Absolute: 0.1
Basophils Relative: 1
Lymphocytes Relative: 38
Monocytes Absolute: 0.4
Monocytes Relative: 6
Neutro Abs: 3.4
Neutrophils Relative %: 49

## 2011-04-11 LAB — CBC
HCT: 40.3
Hemoglobin: 13.6
MCHC: 33.7
MCV: 94.4
Platelets: 312
RDW: 13.1

## 2011-05-29 ENCOUNTER — Encounter: Payer: Self-pay | Admitting: Family Medicine

## 2011-05-30 ENCOUNTER — Encounter: Payer: Self-pay | Admitting: Family Medicine

## 2011-05-30 ENCOUNTER — Ambulatory Visit (INDEPENDENT_AMBULATORY_CARE_PROVIDER_SITE_OTHER): Payer: BC Managed Care – PPO | Admitting: Family Medicine

## 2011-05-30 ENCOUNTER — Encounter: Payer: Self-pay | Admitting: Internal Medicine

## 2011-05-30 ENCOUNTER — Other Ambulatory Visit (HOSPITAL_COMMUNITY)
Admission: RE | Admit: 2011-05-30 | Discharge: 2011-05-30 | Disposition: A | Discharge status: Home / Self Care | Payer: BC Managed Care – PPO | Source: Ambulatory Visit | Attending: Family Medicine | Admitting: Family Medicine

## 2011-05-30 VITALS — BP 110/74 | HR 74 | Temp 97.5°F | Ht 61.5 in | Wt 134.2 lb

## 2011-05-30 DIAGNOSIS — Z0000 Encounter for general adult medical examination without abnormal findings: Secondary | ICD-10-CM

## 2011-05-30 DIAGNOSIS — G43909 Migraine, unspecified, not intractable, without status migrainosus: Secondary | ICD-10-CM

## 2011-05-30 DIAGNOSIS — Z01419 Encounter for gynecological examination (general) (routine) without abnormal findings: Secondary | ICD-10-CM | POA: Insufficient documentation

## 2011-05-30 DIAGNOSIS — F419 Anxiety disorder, unspecified: Secondary | ICD-10-CM

## 2011-05-30 DIAGNOSIS — R195 Other fecal abnormalities: Secondary | ICD-10-CM

## 2011-05-30 DIAGNOSIS — F411 Generalized anxiety disorder: Secondary | ICD-10-CM

## 2011-05-30 LAB — CBC WITH DIFFERENTIAL/PLATELET
Basophils Absolute: 0 K/uL (ref 0.0–0.1)
Basophils Relative: 0.5 % (ref 0.0–3.0)
Eosinophils Absolute: 0.3 K/uL (ref 0.0–0.7)
Eosinophils Relative: 5.1 % — ABNORMAL HIGH (ref 0.0–5.0)
HCT: 36.3 % (ref 36.0–46.0)
Hemoglobin: 12.1 g/dL (ref 12.0–15.0)
Lymphocytes Relative: 33.4 % (ref 12.0–46.0)
Lymphs Abs: 1.9 K/uL (ref 0.7–4.0)
MCHC: 33.5 g/dL (ref 30.0–36.0)
MCV: 95.5 fl (ref 78.0–100.0)
Monocytes Absolute: 0.5 K/uL (ref 0.1–1.0)
Monocytes Relative: 8.1 % (ref 3.0–12.0)
Neutro Abs: 3.1 K/uL (ref 1.4–7.7)
Neutrophils Relative %: 52.9 % (ref 43.0–77.0)
Platelets: 264 K/uL (ref 150.0–400.0)
RBC: 3.8 Mil/uL — ABNORMAL LOW (ref 3.87–5.11)
RDW: 14 % (ref 11.5–14.6)
WBC: 5.8 K/uL (ref 4.5–10.5)

## 2011-05-30 LAB — LIPID PANEL
Cholesterol: 200 mg/dL (ref 0–200)
HDL: 61.1 mg/dL
LDL Cholesterol: 132 mg/dL — ABNORMAL HIGH (ref 0–99)
Total CHOL/HDL Ratio: 3
Triglycerides: 37 mg/dL (ref 0.0–149.0)
VLDL: 7.4 mg/dL (ref 0.0–40.0)

## 2011-05-30 LAB — HEPATIC FUNCTION PANEL
AST: 23 U/L (ref 0–37)
Albumin: 4.1 g/dL (ref 3.5–5.2)
Alkaline Phosphatase: 52 U/L (ref 39–117)
Bilirubin, Direct: 0.1 mg/dL (ref 0.0–0.3)

## 2011-05-30 LAB — BASIC METABOLIC PANEL
Calcium: 8.7 mg/dL (ref 8.4–10.5)
GFR: 69.41 mL/min (ref >60.00)
Sodium: 139 mEq/L (ref 135–145)

## 2011-05-30 LAB — TSH: TSH: 1.53 u[IU]/mL (ref 0.35–5.50)

## 2011-05-30 MED ORDER — ELETRIPTAN HYDROBROMIDE 40 MG PO TABS: ORAL_TABLET | ORAL | Status: DC

## 2011-05-30 MED ORDER — LORAZEPAM 1 MG PO TABS: 1.0000 mg | ORAL_TABLET | Freq: Every day | ORAL | Status: DC | PRN

## 2011-05-30 NOTE — Progress Notes (Signed)
Subjective:     [name] is a 50 y.o. female and is here for a comprehensive physical exam. The patient reports no problems.  History   Social History  . Marital Status: Married    Spouse Name: N/A    Number of Children: N/A  . Years of Education: N/A   Occupational History  . teachers assistant [employer]   Social History Main Topics  . Smoking status: Never Smoker   . Smokeless tobacco: Not on file  . Alcohol Use: No  . Drug Use: No  . Sexually Active: Yes -- Female partner(s)   Other Topics Concern  . Not on file   Social History Narrative  . No narrative on file   Health Maintenance  Topic Date Due  . Pap Smear  11/28/1978  . Colonoscopy  11/28/2010  . Influenza Vaccine  04/08/2012  . Mammogram  12/17/2012  . Tetanus/tdap  03/20/2017    The following portions of the patient's history were reviewed and updated as appropriate: allergies, current medications, past family history, past medical history, past social history, past surgical history and problem list.  Review of Systems Review of Systems  Constitutional: Negative for activity change, appetite change and fatigue.  HENT: Negative for hearing loss, congestion, tinnitus and ear discharge.  dentist q32m Eyes: Negative for visual disturbance (see optho q1y -- vision corrected to 20/20 with glasses).  Respiratory: Negative for cough, chest tightness and shortness of breath.   Cardiovascular: Negative for chest pain, palpitations and leg swelling.  Gastrointestinal: Negative for abdominal pain, diarrhea, constipation and abdominal distention.  Genitourinary: Negative for urgency, frequency, decreased urine volume and difficulty urinating.  Musculoskeletal: Negative for back pain, arthralgias and gait problem.  Skin: Negative for color change, pallor and rash.  Neurological: Negative for dizziness, light-headedness, numbness and headaches.  Hematological: Negative for adenopathy. Does not  bruise/bleed easily.  Psychiatric/Behavioral: Negative for suicidal ideas, confusion, sleep disturbance, self-injury, dysphoric mood, decreased concentration and agitation.       Objective:    BP 110/74  Pulse 74  Temp(Src) 97.5 F (36.4 C) (Oral)  Ht 5' 1.5" (1.562 m)  Wt 134 lb 3.2 oz (60.873 kg)  BMI 24.95 kg/m2  SpO2 99%  LMP 05/28/2011 General appearance: alert, cooperative, appears stated age and no distress Head: Normocephalic, without obvious abnormality, atraumatic Eyes: conjunctivae/corneas clear. PERRL, EOM's intact. Fundi benign. Ears: normal TM's and external ear canals both ears Nose: Nares normal. Septum midline. Mucosa normal. No drainage or sinus tenderness. Throat: lips, mucosa, and tongue normal; teeth and gums normal Neck: no adenopathy, no carotid bruit, no JVD, supple, symmetrical, trachea midline and thyroid not enlarged, symmetric, no tenderness/mass/nodules Back: symmetric, no curvature. ROM normal. No CVA tenderness. Lungs: clear to auscultation bilaterally Breasts: normal appearance, no masses or tenderness Heart: regular rate and rhythm, S1, S2 normal, no murmur, click, rub or gallop Abdomen: soft, non-tender; bowel sounds normal; no masses,  no organomegaly Pelvic: cervix normal in appearance, external genitalia normal, no adnexal masses or tenderness, no cervical motion tenderness, rectovaginal septum normal, uterus normal size, shape, and consistency and vagina normal without discharge Extremities: extremities normal, atraumatic, no cyanosis or edema Pulses: 2+ and symmetric Skin: Skin color, texture, turgor normal. No rashes or lesions Lymph nodes: Cervical, supraclavicular, and axillary nodes normal. Neurologic: Alert and oriented X 3, normal strength and tone. Normal symmetric reflexes. Normal coordination and gait rectal--heme + brown stool    Assessment:    Healthy female exam. Anxiety hyperlipidemia  heme --+ stool--- refer to  gi  Plan:    ghm utd Check fasting labs See After Visit Summary for Counseling Recommendations

## 2011-05-30 NOTE — Patient Instructions (Signed)

## 2011-06-15 ENCOUNTER — Ambulatory Visit: Payer: BC Managed Care – PPO | Admitting: Gastroenterology

## 2011-06-19 ENCOUNTER — Encounter: Payer: Self-pay | Admitting: *Deleted

## 2011-06-19 MED ORDER — ROSUVASTATIN CALCIUM 20 MG PO TABS: 20.0000 mg | ORAL_TABLET | Freq: Every day | ORAL | Status: DC

## 2011-06-19 NOTE — Progress Notes (Signed)
Addended by: Derry Lory A on: 06/19/2011 05:03 PM   Modules accepted: Orders

## 2011-08-06 ENCOUNTER — Other Ambulatory Visit: Payer: Self-pay | Admitting: Family Medicine

## 2011-08-06 DIAGNOSIS — G43909 Migraine, unspecified, not intractable, without status migrainosus: Secondary | ICD-10-CM

## 2011-08-06 MED ORDER — ELETRIPTAN HYDROBROMIDE 40 MG PO TABS: ORAL_TABLET | ORAL | Status: DC

## 2011-08-06 NOTE — Telephone Encounter (Signed)
Faxed.   KP 

## 2011-08-08 ENCOUNTER — Telehealth: Payer: Self-pay | Admitting: *Deleted

## 2011-08-08 NOTE — Telephone Encounter (Signed)
Has she tried anything else ?   May need to see her formulary.

## 2011-08-08 NOTE — Telephone Encounter (Signed)
Prior auth denied coverage is provided for a quantity of medication that's enough for 4 headache treatment days per month.

## 2011-08-09 NOTE — Telephone Encounter (Signed)
Patient stated she had never tried anything else, she had taken it for 10 years--she stated she is willing to try anything that she can take on an onset, she does not want to take anything daily. Please advise    KP

## 2011-08-09 NOTE — Telephone Encounter (Signed)
immitrex 100 mg #12  Take 1 at first sign of headache and may repeat in 2 hours prn

## 2011-08-10 MED ORDER — SUMATRIPTAN SUCCINATE 100 MG PO TABS: ORAL_TABLET | ORAL | Status: DC

## 2011-08-10 NOTE — Telephone Encounter (Signed)
Rx faxed and patient made aware     KP 

## 2012-01-21 ENCOUNTER — Other Ambulatory Visit: Payer: Self-pay | Admitting: Family Medicine

## 2012-01-21 DIAGNOSIS — Z1231 Encounter for screening mammogram for malignant neoplasm of breast: Secondary | ICD-10-CM

## 2012-02-05 ENCOUNTER — Ambulatory Visit: Payer: BC Managed Care – PPO

## 2012-02-11 ENCOUNTER — Ambulatory Visit
Admission: RE | Admit: 2012-02-11 | Discharge: 2012-02-11 | Disposition: A | Discharge status: Home / Self Care | Payer: BC Managed Care – PPO | Source: Ambulatory Visit | Attending: Family Medicine | Admitting: Family Medicine

## 2012-02-11 DIAGNOSIS — Z1231 Encounter for screening mammogram for malignant neoplasm of breast: Secondary | ICD-10-CM

## 2012-06-04 ENCOUNTER — Other Ambulatory Visit: Payer: Self-pay | Admitting: Family Medicine

## 2012-07-28 ENCOUNTER — Encounter: Payer: BC Managed Care – PPO | Admitting: Family Medicine

## 2012-07-28 ENCOUNTER — Encounter: Payer: Self-pay | Admitting: Lab

## 2012-07-29 ENCOUNTER — Ambulatory Visit (INDEPENDENT_AMBULATORY_CARE_PROVIDER_SITE_OTHER): Payer: BC Managed Care – PPO | Admitting: Family Medicine

## 2012-07-29 ENCOUNTER — Encounter: Payer: Self-pay | Admitting: Family Medicine

## 2012-07-29 VITALS — BP 110/68 | HR 72 | Temp 97.6°F | Ht 61.0 in | Wt 133.4 lb

## 2012-07-29 DIAGNOSIS — E785 Hyperlipidemia, unspecified: Secondary | ICD-10-CM

## 2012-07-29 DIAGNOSIS — Z0000 Encounter for general adult medical examination without abnormal findings: Secondary | ICD-10-CM

## 2012-07-29 DIAGNOSIS — G43909 Migraine, unspecified, not intractable, without status migrainosus: Secondary | ICD-10-CM

## 2012-07-29 DIAGNOSIS — Z85820 Personal history of malignant melanoma of skin: Secondary | ICD-10-CM

## 2012-07-29 MED ORDER — SUMATRIPTAN SUCCINATE 100 MG PO TABS: ORAL_TABLET | ORAL | Status: DC

## 2012-07-29 NOTE — Assessment & Plan Note (Signed)
Pt still sees derm regularly

## 2012-07-29 NOTE — Patient Instructions (Addendum)
Preventive Care for Adults, Female A healthy lifestyle and preventive care can promote health and wellness. Preventive health guidelines for women include the following key practices.  A routine yearly physical is a good way to check with your caregiver about your health and preventive screening. It is a chance to share any concerns and updates on your health, and to receive a thorough exam.  Visit your dentist for a routine exam and preventive care every 6 months. Brush your teeth twice a day and floss once a day. Good oral hygiene prevents tooth decay and gum disease.  The frequency of eye exams is based on your age, health, family medical history, use of contact lenses, and other factors. Follow your caregiver's recommendations for frequency of eye exams.  Eat a healthy diet. Foods like vegetables, fruits, whole grains, low-fat dairy products, and lean protein foods contain the nutrients you need without too many calories. Decrease your intake of foods high in solid fats, added sugars, and salt. Eat the right amount of calories for you.Get information about a proper diet from your caregiver, if necessary.  Regular physical exercise is one of the most important things you can do for your health. Most adults should get at least 150 minutes of moderate-intensity exercise (any activity that increases your heart rate and causes you to sweat) each week. In addition, most adults need muscle-strengthening exercises on 2 or more days a week.  Maintain a healthy weight. The body mass index (BMI) is a screening tool to identify possible weight problems. It provides an estimate of body fat based on height and weight. Your caregiver can help determine your BMI, and can help you achieve or maintain a healthy weight.For adults 20 years and older:  A BMI below 18.5 is considered underweight.  A BMI of 18.5 to 24.9 is normal.  A BMI of 25 to 29.9 is considered overweight.  A BMI of 30 and above is  considered obese.  Maintain normal blood lipids and cholesterol levels by exercising and minimizing your intake of saturated fat. Eat a balanced diet with plenty of fruit and vegetables. Blood tests for lipids and cholesterol should begin at age 20 and be repeated every 5 years. If your lipid or cholesterol levels are high, you are over 50, or you are at high risk for heart disease, you may need your cholesterol levels checked more frequently.Ongoing high lipid and cholesterol levels should be treated with medicines if diet and exercise are not effective.  If you smoke, find out from your caregiver how to quit. If you do not use tobacco, do not start.  If you are pregnant, do not drink alcohol. If you are breastfeeding, be very cautious about drinking alcohol. If you are not pregnant and choose to drink alcohol, do not exceed 1 drink per day. One drink is considered to be 12 ounces (355 mL) of beer, 5 ounces (148 mL) of wine, or 1.5 ounces (44 mL) of liquor.  Avoid use of street drugs. Do not share needles with anyone. Ask for help if you need support or instructions about stopping the use of drugs.  High blood pressure causes heart disease and increases the risk of stroke. Your blood pressure should be checked at least every 1 to 2 years. Ongoing high blood pressure should be treated with medicines if weight loss and exercise are not effective.  If you are 55 to 52 years old, ask your caregiver if you should take aspirin to prevent strokes.  Diabetes   screening involves taking a blood sample to check your fasting blood sugar level. This should be done once every 3 years, after age 45, if you are within normal weight and without risk factors for diabetes. Testing should be considered at a younger age or be carried out more frequently if you are overweight and have at least 1 risk factor for diabetes.  Breast cancer screening is essential preventive care for women. You should practice "breast  self-awareness." This means understanding the normal appearance and feel of your breasts and may include breast self-examination. Any changes detected, no matter how small, should be reported to a caregiver. Women in their 20s and 30s should have a clinical breast exam (CBE) by a caregiver as part of a regular health exam every 1 to 3 years. After age 40, women should have a CBE every year. Starting at age 40, women should consider having a mammography (breast X-ray test) every year. Women who have a family history of breast cancer should talk to their caregiver about genetic screening. Women at a high risk of breast cancer should talk to their caregivers about having magnetic resonance imaging (MRI) and a mammography every year.  The Pap test is a screening test for cervical cancer. A Pap test can show cell changes on the cervix that might become cervical cancer if left untreated. A Pap test is a procedure in which cells are obtained and examined from the lower end of the uterus (cervix).  Women should have a Pap test starting at age 21.  Between ages 21 and 29, Pap tests should be repeated every 2 years.  Beginning at age 30, you should have a Pap test every 3 years as long as the past 3 Pap tests have been normal.  Some women have medical problems that increase the chance of getting cervical cancer. Talk to your caregiver about these problems. It is especially important to talk to your caregiver if a new problem develops soon after your last Pap test. In these cases, your caregiver may recommend more frequent screening and Pap tests.  The above recommendations are the same for women who have or have not gotten the vaccine for human papillomavirus (HPV).  If you had a hysterectomy for a problem that was not cancer or a condition that could lead to cancer, then you no longer need Pap tests. Even if you no longer need a Pap test, a regular exam is a good idea to make sure no other problems are  starting.  If you are between ages 65 and 70, and you have had normal Pap tests going back 10 years, you no longer need Pap tests. Even if you no longer need a Pap test, a regular exam is a good idea to make sure no other problems are starting.  If you have had past treatment for cervical cancer or a condition that could lead to cancer, you need Pap tests and screening for cancer for at least 20 years after your treatment.  If Pap tests have been discontinued, risk factors (such as a new sexual partner) need to be reassessed to determine if screening should be resumed.  The HPV test is an additional test that may be used for cervical cancer screening. The HPV test looks for the virus that can cause the cell changes on the cervix. The cells collected during the Pap test can be tested for HPV. The HPV test could be used to screen women aged 30 years and older, and should   be used in women of any age who have unclear Pap test results. After the age of 30, women should have HPV testing at the same frequency as a Pap test.  Colorectal cancer can be detected and often prevented. Most routine colorectal cancer screening begins at the age of 50 and continues through age 75. However, your caregiver may recommend screening at an earlier age if you have risk factors for colon cancer. On a yearly basis, your caregiver may provide home test kits to check for hidden blood in the stool. Use of a small camera at the end of a tube, to directly examine the colon (sigmoidoscopy or colonoscopy), can detect the earliest forms of colorectal cancer. Talk to your caregiver about this at age 50, when routine screening begins. Direct examination of the colon should be repeated every 5 to 10 years through age 75, unless early forms of pre-cancerous polyps or small growths are found.  Hepatitis C blood testing is recommended for all people born from 1945 through 1965 and any individual with known risks for hepatitis C.  Practice  safe sex. Use condoms and avoid high-risk sexual practices to reduce the spread of sexually transmitted infections (STIs). STIs include gonorrhea, chlamydia, syphilis, trichomonas, herpes, HPV, and human immunodeficiency virus (HIV). Herpes, HIV, and HPV are viral illnesses that have no cure. They can result in disability, cancer, and death. Sexually active women aged 25 and younger should be checked for chlamydia. Older women with new or multiple partners should also be tested for chlamydia. Testing for other STIs is recommended if you are sexually active and at increased risk.  Osteoporosis is a disease in which the bones lose minerals and strength with aging. This can result in serious bone fractures. The risk of osteoporosis can be identified using a bone density scan. Women ages 65 and over and women at risk for fractures or osteoporosis should discuss screening with their caregivers. Ask your caregiver whether you should take a calcium supplement or vitamin D to reduce the rate of osteoporosis.  Menopause can be associated with physical symptoms and risks. Hormone replacement therapy is available to decrease symptoms and risks. You should talk to your caregiver about whether hormone replacement therapy is right for you.  Use sunscreen with sun protection factor (SPF) of 30 or more. Apply sunscreen liberally and repeatedly throughout the day. You should seek shade when your shadow is shorter than you. Protect yourself by wearing long sleeves, pants, a wide-brimmed hat, and sunglasses year round, whenever you are outdoors.  Once a month, do a whole body skin exam, using a mirror to look at the skin on your back. Notify your caregiver of new moles, moles that have irregular borders, moles that are larger than a pencil eraser, or moles that have changed in shape or color.  Stay current with required immunizations.  Influenza. You need a dose every fall (or winter). The composition of the flu vaccine  changes each year, so being vaccinated once is not enough.  Pneumococcal polysaccharide. You need 1 to 2 doses if you smoke cigarettes or if you have certain chronic medical conditions. You need 1 dose at age 65 (or older) if you have never been vaccinated.  Tetanus, diphtheria, pertussis (Tdap, Td). Get 1 dose of Tdap vaccine if you are younger than age 65, are over 65 and have contact with an infant, are a healthcare worker, are pregnant, or simply want to be protected from whooping cough. After that, you need a Td   booster dose every 10 years. Consult your caregiver if you have not had at least 3 tetanus and diphtheria-containing shots sometime in your life or have a deep or dirty wound.  HPV. You need this vaccine if you are a woman age 26 or younger. The vaccine is given in 3 doses over 6 months.  Measles, mumps, rubella (MMR). You need at least 1 dose of MMR if you were born in 1957 or later. You may also need a second dose.  Meningococcal. If you are age 19 to 21 and a first-year college student living in a residence hall, or have one of several medical conditions, you need to get vaccinated against meningococcal disease. You may also need additional booster doses.  Zoster (shingles). If you are age 60 or older, you should get this vaccine.  Varicella (chickenpox). If you have never had chickenpox or you were vaccinated but received only 1 dose, talk to your caregiver to find out if you need this vaccine.  Hepatitis A. You need this vaccine if you have a specific risk factor for hepatitis A virus infection or you simply wish to be protected from this disease. The vaccine is usually given as 2 doses, 6 to 18 months apart.  Hepatitis B. You need this vaccine if you have a specific risk factor for hepatitis B virus infection or you simply wish to be protected from this disease. The vaccine is given in 3 doses, usually over 6 months. Preventive Services / Frequency Ages 19 to 39  Blood  pressure check.** / Every 1 to 2 years.  Lipid and cholesterol check.** / Every 5 years beginning at age 20.  Clinical breast exam.** / Every 3 years for women in their 20s and 30s.  Pap test.** / Every 2 years from ages 21 through 29. Every 3 years starting at age 30 through age 65 or 70 with a history of 3 consecutive normal Pap tests.  HPV screening.** / Every 3 years from ages 30 through ages 65 to 70 with a history of 3 consecutive normal Pap tests.  Hepatitis C blood test.** / For any individual with known risks for hepatitis C.  Skin self-exam. / Monthly.  Influenza immunization.** / Every year.  Pneumococcal polysaccharide immunization.** / 1 to 2 doses if you smoke cigarettes or if you have certain chronic medical conditions.  Tetanus, diphtheria, pertussis (Tdap, Td) immunization. / A one-time dose of Tdap vaccine. After that, you need a Td booster dose every 10 years.  HPV immunization. / 3 doses over 6 months, if you are 26 and younger.  Measles, mumps, rubella (MMR) immunization. / You need at least 1 dose of MMR if you were born in 1957 or later. You may also need a second dose.  Meningococcal immunization. / 1 dose if you are age 19 to 21 and a first-year college student living in a residence hall, or have one of several medical conditions, you need to get vaccinated against meningococcal disease. You may also need additional booster doses.  Varicella immunization.** / Consult your caregiver.  Hepatitis A immunization.** / Consult your caregiver. 2 doses, 6 to 18 months apart.  Hepatitis B immunization.** / Consult your caregiver. 3 doses usually over 6 months. Ages 40 to 64  Blood pressure check.** / Every 1 to 2 years.  Lipid and cholesterol check.** / Every 5 years beginning at age 20.  Clinical breast exam.** / Every year after age 40.  Mammogram.** / Every year beginning at age 40   and continuing for as long as you are in good health. Consult with your  caregiver.  Pap test.** / Every 3 years starting at age 30 through age 65 or 70 with a history of 3 consecutive normal Pap tests.  HPV screening.** / Every 3 years from ages 30 through ages 65 to 70 with a history of 3 consecutive normal Pap tests.  Fecal occult blood test (FOBT) of stool. / Every year beginning at age 50 and continuing until age 75. You may not need to do this test if you get a colonoscopy every 10 years.  Flexible sigmoidoscopy or colonoscopy.** / Every 5 years for a flexible sigmoidoscopy or every 10 years for a colonoscopy beginning at age 50 and continuing until age 75.  Hepatitis C blood test.** / For all people born from 1945 through 1965 and any individual with known risks for hepatitis C.  Skin self-exam. / Monthly.  Influenza immunization.** / Every year.  Pneumococcal polysaccharide immunization.** / 1 to 2 doses if you smoke cigarettes or if you have certain chronic medical conditions.  Tetanus, diphtheria, pertussis (Tdap, Td) immunization.** / A one-time dose of Tdap vaccine. After that, you need a Td booster dose every 10 years.  Measles, mumps, rubella (MMR) immunization. / You need at least 1 dose of MMR if you were born in 1957 or later. You may also need a second dose.  Varicella immunization.** / Consult your caregiver.  Meningococcal immunization.** / Consult your caregiver.  Hepatitis A immunization.** / Consult your caregiver. 2 doses, 6 to 18 months apart.  Hepatitis B immunization.** / Consult your caregiver. 3 doses, usually over 6 months. Ages 65 and over  Blood pressure check.** / Every 1 to 2 years.  Lipid and cholesterol check.** / Every 5 years beginning at age 20.  Clinical breast exam.** / Every year after age 40.  Mammogram.** / Every year beginning at age 40 and continuing for as long as you are in good health. Consult with your caregiver.  Pap test.** / Every 3 years starting at age 30 through age 65 or 70 with a 3  consecutive normal Pap tests. Testing can be stopped between 65 and 70 with 3 consecutive normal Pap tests and no abnormal Pap or HPV tests in the past 10 years.  HPV screening.** / Every 3 years from ages 30 through ages 65 or 70 with a history of 3 consecutive normal Pap tests. Testing can be stopped between 65 and 70 with 3 consecutive normal Pap tests and no abnormal Pap or HPV tests in the past 10 years.  Fecal occult blood test (FOBT) of stool. / Every year beginning at age 50 and continuing until age 75. You may not need to do this test if you get a colonoscopy every 10 years.  Flexible sigmoidoscopy or colonoscopy.** / Every 5 years for a flexible sigmoidoscopy or every 10 years for a colonoscopy beginning at age 50 and continuing until age 75.  Hepatitis C blood test.** / For all people born from 1945 through 1965 and any individual with known risks for hepatitis C.  Osteoporosis screening.** / A one-time screening for women ages 65 and over and women at risk for fractures or osteoporosis.  Skin self-exam. / Monthly.  Influenza immunization.** / Every year.  Pneumococcal polysaccharide immunization.** / 1 dose at age 65 (or older) if you have never been vaccinated.  Tetanus, diphtheria, pertussis (Tdap, Td) immunization. / A one-time dose of Tdap vaccine if you are over   65 and have contact with an infant, are a healthcare worker, or simply want to be protected from whooping cough. After that, you need a Td booster dose every 10 years.  Varicella immunization.** / Consult your caregiver.  Meningococcal immunization.** / Consult your caregiver.  Hepatitis A immunization.** / Consult your caregiver. 2 doses, 6 to 18 months apart.  Hepatitis B immunization.** / Check with your caregiver. 3 doses, usually over 6 months. ** Family history and personal history of risk and conditions may change your caregiver's recommendations. Document Released: 08/21/2001 Document Revised: 09/17/2011  Document Reviewed: 11/20/2010 [employer] Patient Information 2013 [employer].  

## 2012-07-29 NOTE — Progress Notes (Signed)
Subjective:     [name] is a 52 y.o. female and is here for a comprehensive physical exam. The patient reports no problems.  History   Social History  . Marital Status: Married    Spouse Name: N/A    Number of Children: N/A  . Years of Education: N/A   Occupational History  . teachers assistant [employer]   Social History Main Topics  . Smoking status: Never Smoker   . Smokeless tobacco: Not on file  . Alcohol Use: No  . Drug Use: No  . Sexually Active: Yes -- Female partner(s)   Other Topics Concern  . Not on file   Social History Narrative  . No narrative on file   Health Maintenance  Topic Date Due  . Colonoscopy  11/28/2010  . Mammogram  02/10/2014  . Pap Smear  05/29/2014  . Tetanus/tdap  03/20/2017  . Influenza Vaccine  03/09/2012    The following portions of the patient's history were reviewed and updated as appropriate:  She  has a past medical history of Gestational HTN; Melanoma; Asthma; Heart murmur; and Hyperlipidemia. She  does not have any pertinent problems on file. She  has past surgical history that includes moles removed and nasal polyps. Her family history includes Cancer in her maternal uncle and other; Diabetes in her maternal grandfather; Heart disease (age of onset:80) in her maternal grandfather; Hyperlipidemia in her maternal grandfather and maternal grandmother; Hypertension in her mother; and Stroke in her maternal grandmother. She  reports that she has never smoked. She does not have any smokeless tobacco history on file. She reports that she does not drink alcohol or use illicit drugs. She has a current medication list which includes the following prescription(s): loratadine, lorazepam, and sumatriptan. Current Outpatient Prescriptions on File Prior to Visit  Medication Sig Dispense Refill  . loratadine (CLARITIN) 10 MG tablet Take 10 mg by mouth daily.      Marland Kitchen LORazepam (ATIVAN) 1 MG tablet Take 1 tablet (1 mg total) by mouth  daily as needed.  90 tablet  0  . SUMAtriptan (IMITREX) 100 MG tablet As directed  12 tablet  0   She  has no known allergies..  Review of Systems Review of Systems  Constitutional: Negative for activity change, appetite change and fatigue.  HENT: Negative for hearing loss, congestion, tinnitus and ear discharge.  dentist q36m Eyes: Negative for visual disturbance (see optho q1y -- vision corrected to 20/20 with glasses).  Respiratory: Negative for cough, chest tightness and shortness of breath.   Cardiovascular: Negative for chest pain, palpitations and leg swelling.  Gastrointestinal: Negative for abdominal pain, diarrhea, constipation and abdominal distention.  Genitourinary: Negative for urgency, frequency, decreased urine volume and difficulty urinating.  Musculoskeletal: Negative for back pain, arthralgias and gait problem.  Skin: Negative for color change, pallor and rash.  Neurological: Negative for dizziness, light-headedness, numbness and headaches.  Hematological: Negative for adenopathy. Does not bruise/bleed easily.  Psychiatric/Behavioral: Negative for suicidal ideas, confusion, sleep disturbance, self-injury, dysphoric mood, decreased concentration and agitation.       Objective:    BP 110/68  Pulse 72  Temp 97.6 F (36.4 C) (Oral)  Ht 5\' 1"  (1.549 m)  Wt 133 lb 6.4 oz (60.51 kg)  BMI 25.21 kg/m2  SpO2 96% General appearance: alert, cooperative, appears stated age and no distress Head: Normocephalic, without obvious abnormality, atraumatic Eyes: conjunctivae/corneas clear. PERRL, EOM's intact. Fundi benign. Ears: normal TM's and external ear canals both ears  Nose: Nares normal. Septum midline. Mucosa normal. No drainage or sinus tenderness. Throat: lips, mucosa, and tongue normal; teeth and gums normal Neck: no adenopathy, no carotid bruit, no JVD, supple, symmetrical, trachea midline and thyroid not enlarged, symmetric, no tenderness/mass/nodules Back:  symmetric, no curvature. ROM normal. No CVA tenderness. Lungs: clear to auscultation bilaterally Breasts: normal appearance, no masses or tenderness Heart: regular rate and rhythm, S1, S2 normal, no murmur, click, rub or gallop Abdomen: soft, non-tender; bowel sounds normal; no masses,  no organomegaly Pelvic: deferred Extremities: extremities normal, atraumatic, no cyanosis or edema Pulses: 2+ and symmetric Skin: Skin color, texture, turgor normal. No rashes or lesions Lymph nodes: Cervical, supraclavicular, and axillary nodes normal. Neurologic: Alert and oriented X 3, normal strength and tone. Normal symmetric reflexes. Normal coordination and gait psych-- no depression , no anxiety    Assessment:    Healthy female exam.      Plan:     See After Visit Summary for Counseling Recommendations

## 2012-07-29 NOTE — Assessment & Plan Note (Signed)
Pt never took meds Check labs

## 2012-07-30 ENCOUNTER — Encounter: Payer: Self-pay | Admitting: Internal Medicine

## 2012-07-31 ENCOUNTER — Other Ambulatory Visit: Payer: BC Managed Care – PPO

## 2012-08-01 ENCOUNTER — Other Ambulatory Visit (INDEPENDENT_AMBULATORY_CARE_PROVIDER_SITE_OTHER): Payer: BC Managed Care – PPO

## 2012-08-01 DIAGNOSIS — Z0000 Encounter for general adult medical examination without abnormal findings: Secondary | ICD-10-CM

## 2012-08-01 LAB — HEPATIC FUNCTION PANEL
ALT: 26 U/L (ref 0–35)
Bilirubin, Direct: 0 mg/dL (ref 0.0–0.3)
Total Bilirubin: 0.4 mg/dL (ref 0.3–1.2)

## 2012-08-01 LAB — CBC WITH DIFFERENTIAL/PLATELET
Eosinophils Relative: 8.1 % — ABNORMAL HIGH (ref 0.0–5.0)
HCT: 35.1 % — ABNORMAL LOW (ref 36.0–46.0)
Hemoglobin: 11.9 g/dL — ABNORMAL LOW (ref 12.0–15.0)
Lymphs Abs: 1.8 10*3/uL (ref 0.7–4.0)
MCV: 91.8 fl (ref 78.0–100.0)
Monocytes Absolute: 0.4 10*3/uL (ref 0.1–1.0)
Monocytes Relative: 7.8 % (ref 3.0–12.0)
Neutro Abs: 2.4 10*3/uL (ref 1.4–7.7)
RDW: 13.4 % (ref 11.5–14.6)
WBC: 5 10*3/uL (ref 4.5–10.5)

## 2012-08-01 LAB — LIPID PANEL
HDL: 50.1 mg/dL (ref >39.00)
LDL Cholesterol: 115 mg/dL — ABNORMAL HIGH (ref 0–99)
VLDL: 10 mg/dL (ref 0.0–40.0)

## 2012-08-01 LAB — BASIC METABOLIC PANEL
BUN: 20 mg/dL (ref 6–23)
Chloride: 105 mEq/L (ref 96–112)
GFR: 86.36 mL/min (ref >60.00)
Glucose, Bld: 90 mg/dL (ref 70–99)
Potassium: 4.1 mEq/L (ref 3.5–5.1)
Sodium: 138 mEq/L (ref 135–145)

## 2012-09-09 ENCOUNTER — Encounter: Payer: BC Managed Care – PPO | Admitting: Internal Medicine

## 2012-09-11 ENCOUNTER — Ambulatory Visit (AMBULATORY_SURGERY_CENTER): Payer: BC Managed Care – PPO | Admitting: *Deleted

## 2012-09-11 VITALS — Ht 60.0 in | Wt 134.6 lb

## 2012-09-11 DIAGNOSIS — Z1211 Encounter for screening for malignant neoplasm of colon: Secondary | ICD-10-CM

## 2012-09-11 MED ORDER — NA SULFATE-K SULFATE-MG SULF 17.5-3.13-1.6 GM/177ML PO SOLN: 1.0000 | Freq: Once | ORAL | Status: DC

## 2012-09-11 NOTE — Progress Notes (Signed)
NO EGG OR SOY ALLERGY. EWM  PT STATES SHE GETS NAUSEATED WITH THE SEDATION AND  WITH GALL BLADDER HARD TIME WAKING UP. EWM

## 2012-09-15 ENCOUNTER — Encounter: Payer: Self-pay | Admitting: Internal Medicine

## 2012-09-25 ENCOUNTER — Encounter: Payer: Self-pay | Admitting: Internal Medicine

## 2012-09-25 ENCOUNTER — Ambulatory Visit (AMBULATORY_SURGERY_CENTER): Payer: BC Managed Care – PPO | Admitting: Internal Medicine

## 2012-09-25 VITALS — BP 133/75 | HR 66 | Temp 98.1°F | Resp 19 | Ht 60.0 in | Wt 134.0 lb

## 2012-09-25 MED ORDER — SODIUM CHLORIDE 0.9 % IV SOLN: 500.0000 mL | INTRAVENOUS | Status: DC

## 2012-09-25 NOTE — Progress Notes (Signed)
Patient did not experience any of the following events: a burn prior to discharge; a fall within the facility; wrong site/side/patient/procedure/implant event; or a hospital transfer or hospital admission upon discharge from the facility. (G8907) Patient did not have preoperative order for IV antibiotic SSI prophylaxis. ([id number])  

## 2012-09-25 NOTE — Progress Notes (Signed)
Lidocaine-40mg IV prior to Propofol InductionPropofol given over incremental dosages 

## 2012-09-25 NOTE — Patient Instructions (Addendum)
No polyps seen! You do have diverticulosis, which does not usually cause significant problems. Please read the handout provided.  Next routine colonoscopy in about 10 years - 2024.  Thank you for choosing me and [facility].  Iva Boop, MD, FACG YOU HAD AN ENDOSCOPIC PROCEDURE TODAY AT THE [facility]: Refer to the procedure report that was given to you for any specific questions about what was found during the examination.  If the procedure report does not answer your questions, please call your gastroenterologist to clarify.  If you requested that your care partner not be given the details of your procedure findings, then the procedure report has been included in a sealed envelope for you to review at your convenience later.  YOU SHOULD EXPECT: Some feelings of bloating in the abdomen. Passage of more gas than usual.  Walking can help get rid of the air that was put into your GI tract during the procedure and reduce the bloating. If you had a lower endoscopy (such as a colonoscopy or flexible sigmoidoscopy) you may notice spotting of blood in your stool or on the toilet paper. If you underwent a bowel prep for your procedure, then you may not have a normal bowel movement for a few days.  DIET: Your first meal following the procedure should be a light meal and then it is ok to progress to your normal diet.  A half-sandwich or bowl of soup is an example of a good first meal.  Heavy or fried foods are harder to digest and may make you feel nauseous or bloated.  Likewise meals heavy in dairy and vegetables can cause extra gas to form and this can also increase the bloating.  Drink plenty of fluids but you should avoid alcoholic beverages for 24 hours.  ACTIVITY: Your care partner should take you home directly after the procedure.  You should plan to take it easy, moving slowly for the rest of the day.  You can resume normal activity the day after the procedure however you  should NOT DRIVE or use heavy machinery for 24 hours (because of the sedation medicines used during the test).    SYMPTOMS TO REPORT IMMEDIATELY: A gastroenterologist can be reached at any hour.  During normal business hours, 8:30 AM to 5:00 PM Monday through Friday, call [phone].  After hours and on weekends, please call the GI answering service at [phone] who will take a message and have the physician on call contact you.   Following lower endoscopy (colonoscopy or flexible sigmoidoscopy):  Excessive amounts of blood in the stool  Significant tenderness or worsening of abdominal pains  Swelling of the abdomen that is new, acute  Fever of 100F or higher  Following upper endoscopy (EGD)  Vomiting of blood or coffee ground material  New chest pain or pain under the shoulder blades  Painful or persistently difficult swallowing  New shortness of breath  Fever of 100F or higher  Black, tarry-looking stools  FOLLOW UP: If any biopsies were taken you will be contacted by phone or by letter within the next 1-3 weeks.  Call your gastroenterologist if you have not heard about the biopsies in 3 weeks.  Our staff will call the home number listed on your records the next business day following your procedure to check on you and address any questions or concerns that you may have at that time regarding the information given to you following your procedure. This is a courtesy call  and so if there is no answer at the home number and we have not heard from you through the emergency physician on call, we will assume that you have returned to your regular daily activities without incident.  SIGNATURES/CONFIDENTIALITY: You and/or your care partner have signed paperwork which will be entered into your electronic medical record.  These signatures attest to the fact that that the information above on your After Visit Summary has been reviewed and is understood.  Full responsibility of the  confidentiality of this discharge information lies with you and/or your care-partner.

## 2012-09-25 NOTE — Op Note (Signed)
[facility] [address] [place], [address]   COLONOSCOPY PROCEDURE REPORT  PATIENT: Jadaya, [place]  MR#: [id number] BIRTHDATE: 1961-02-15 , 51  yrs. old GENDER: Female ENDOSCOPIST: Iva Boop, MD, [facility] REFERRED MW:NUUVOZ Lowne, DO PROCEDURE DATE:  09/25/2012 PROCEDURE:   Colonoscopy, screening ASA CLASS:   Class II INDICATIONS:average risk screening. MEDICATIONS: propofol (Diprivan) 250mg  IV, MAC sedation, administered by CRNA, and These medications were titrated to patient response per physician's verbal order  DESCRIPTION OF PROCEDURE:   After the risks benefits and alternatives of the procedure were thoroughly explained, informed consent was obtained.  A digital rectal exam revealed no abnormalities of the rectum.   The LB [id number]  endoscope was introduced through the anus and advanced to the cecum, which was identified by both the appendix and ileocecal valve. No adverse events experienced.   The quality of the prep was Suprep excellent The instrument was then slowly withdrawn as the colon was fully examined.      COLON FINDINGS: Moderate diverticulosis was noted in the sigmoid colon.   The colon mucosa was otherwise normal.   A right colon retroflexion was performed.  Retroflexed views revealed no abnormalities. The time to cecum=3 minutes 13 seconds.  Withdrawal time=7 minutes 26 seconds.  The scope was withdrawn and the procedure completed. COMPLICATIONS: There were no complications.  ENDOSCOPIC IMPRESSION: 1.   Moderate diverticulosis was noted in the sigmoid colon 2.   The colon mucosa was otherwise normal  RECOMMENDATIONS: Repeat Colonscopy in 10 years.   eSigned:  Iva Boop, MD, [facility] 09/25/2012 10:25 AM   cc: Lelon Perla, DO and The Patient

## 2012-09-26 ENCOUNTER — Telehealth: Payer: Self-pay | Admitting: *Deleted

## 2012-09-26 NOTE — Telephone Encounter (Signed)
No answer, left message to call if questions or concerns. 

## 2012-10-12 ENCOUNTER — Other Ambulatory Visit: Payer: Self-pay | Admitting: Family Medicine

## 2013-03-31 ENCOUNTER — Other Ambulatory Visit: Payer: Self-pay

## 2013-03-31 DIAGNOSIS — Z1231 Encounter for screening mammogram for malignant neoplasm of breast: Secondary | ICD-10-CM

## 2013-04-22 ENCOUNTER — Ambulatory Visit
Admission: RE | Admit: 2013-04-22 | Discharge: 2013-04-22 | Disposition: A | Discharge status: Home / Self Care | Payer: BC Managed Care – PPO | Source: Ambulatory Visit

## 2013-04-22 DIAGNOSIS — Z1231 Encounter for screening mammogram for malignant neoplasm of breast: Secondary | ICD-10-CM

## 2013-05-14 ENCOUNTER — Other Ambulatory Visit: Payer: Self-pay

## 2013-10-08 ENCOUNTER — Telehealth: Payer: Self-pay

## 2013-10-08 NOTE — Telephone Encounter (Signed)
Left message for call back Non-identifiable   Pap- 05/30/11- normal CCS- 09/25/12-moderate diverticulosis; repeat in 10 years MMG- 04/22/13-negative Flu Td- 03/21/07

## 2013-10-12 ENCOUNTER — Encounter: Payer: Self-pay | Admitting: Internal Medicine

## 2013-10-12 ENCOUNTER — Ambulatory Visit (INDEPENDENT_AMBULATORY_CARE_PROVIDER_SITE_OTHER): Payer: BC Managed Care – PPO | Admitting: Family Medicine

## 2013-10-12 ENCOUNTER — Encounter: Payer: Self-pay | Admitting: Family Medicine

## 2013-10-12 ENCOUNTER — Other Ambulatory Visit (HOSPITAL_COMMUNITY)
Admission: RE | Admit: 2013-10-12 | Discharge: 2013-10-12 | Disposition: A | Discharge status: Home / Self Care | Payer: BC Managed Care – PPO | Source: Ambulatory Visit | Attending: Family Medicine | Admitting: Family Medicine

## 2013-10-12 VITALS — BP 120/76 | HR 81 | Temp 97.5°F | Ht 61.0 in | Wt 134.0 lb

## 2013-10-12 DIAGNOSIS — Z0000 Encounter for general adult medical examination without abnormal findings: Secondary | ICD-10-CM

## 2013-10-12 DIAGNOSIS — F411 Generalized anxiety disorder: Secondary | ICD-10-CM

## 2013-10-12 DIAGNOSIS — Z01419 Encounter for gynecological examination (general) (routine) without abnormal findings: Secondary | ICD-10-CM | POA: Insufficient documentation

## 2013-10-12 DIAGNOSIS — Z124 Encounter for screening for malignant neoplasm of cervix: Secondary | ICD-10-CM

## 2013-10-12 DIAGNOSIS — R319 Hematuria, unspecified: Secondary | ICD-10-CM

## 2013-10-12 DIAGNOSIS — R1319 Other dysphagia: Secondary | ICD-10-CM

## 2013-10-12 DIAGNOSIS — G43909 Migraine, unspecified, not intractable, without status migrainosus: Secondary | ICD-10-CM

## 2013-10-12 DIAGNOSIS — F419 Anxiety disorder, unspecified: Secondary | ICD-10-CM

## 2013-10-12 DIAGNOSIS — N841 Polyp of cervix uteri: Secondary | ICD-10-CM

## 2013-10-12 DIAGNOSIS — Z1151 Encounter for screening for human papillomavirus (HPV): Secondary | ICD-10-CM | POA: Insufficient documentation

## 2013-10-12 DIAGNOSIS — K219 Gastro-esophageal reflux disease without esophagitis: Secondary | ICD-10-CM

## 2013-10-12 LAB — BASIC METABOLIC PANEL
BUN: 15 mg/dL (ref 6–23)
CALCIUM: 9.2 mg/dL (ref 8.4–10.5)
CHLORIDE: 104 meq/L (ref 96–112)
CO2: 28 meq/L (ref 19–32)
CREATININE: 0.7 mg/dL (ref 0.4–1.2)
GFR: 96.25 mL/min (ref >60.00)
Glucose, Bld: 79 mg/dL (ref 70–99)
Potassium: 3.8 mEq/L (ref 3.5–5.1)
Sodium: 140 mEq/L (ref 135–145)

## 2013-10-12 LAB — CBC WITH DIFFERENTIAL/PLATELET
BASOS ABS: 0 10*3/uL (ref 0.0–0.1)
BASOS PCT: 0.5 % (ref 0.0–3.0)
EOS ABS: 0.3 10*3/uL (ref 0.0–0.7)
Eosinophils Relative: 6.2 % — ABNORMAL HIGH (ref 0.0–5.0)
HCT: 36.4 % (ref 36.0–46.0)
Hemoglobin: 12 g/dL (ref 12.0–15.0)
LYMPHS PCT: 34.7 % (ref 12.0–46.0)
Lymphs Abs: 1.8 10*3/uL (ref 0.7–4.0)
MCHC: 33 g/dL (ref 30.0–36.0)
MCV: 93.2 fl (ref 78.0–100.0)
MONO ABS: 0.4 10*3/uL (ref 0.1–1.0)
Monocytes Relative: 8.6 % (ref 3.0–12.0)
NEUTROS PCT: 50 % (ref 43.0–77.0)
Neutro Abs: 2.6 10*3/uL (ref 1.4–7.7)
PLATELETS: 324 10*3/uL (ref 150.0–400.0)
RBC: 3.9 Mil/uL (ref 3.87–5.11)
RDW: 13.4 % (ref 11.5–14.6)
WBC: 5.1 10*3/uL (ref 4.5–10.5)

## 2013-10-12 LAB — LIPID PANEL
Cholesterol: 202 mg/dL — ABNORMAL HIGH (ref 0–200)
HDL: 52.9 mg/dL (ref >39.00)
LDL CALC: 139 mg/dL — AB (ref 0–99)
TRIGLYCERIDES: 53 mg/dL (ref 0.0–149.0)
Total CHOL/HDL Ratio: 4
VLDL: 10.6 mg/dL (ref 0.0–40.0)

## 2013-10-12 LAB — HEPATIC FUNCTION PANEL
ALK PHOS: 76 U/L (ref 39–117)
ALT: 25 U/L (ref 0–35)
AST: 21 U/L (ref 0–37)
Albumin: 3.9 g/dL (ref 3.5–5.2)
BILIRUBIN TOTAL: 0.3 mg/dL (ref 0.3–1.2)
Bilirubin, Direct: 0 mg/dL (ref 0.0–0.3)
TOTAL PROTEIN: 7.1 g/dL (ref 6.0–8.3)

## 2013-10-12 LAB — TSH: TSH: 1.98 u[IU]/mL (ref 0.35–5.50)

## 2013-10-12 MED ORDER — OMEPRAZOLE 40 MG PO CPDR: 40.0000 mg | DELAYED_RELEASE_CAPSULE | Freq: Every day | ORAL | Status: DC

## 2013-10-12 MED ORDER — SUMATRIPTAN SUCCINATE 100 MG PO TABS: ORAL_TABLET | ORAL | Status: DC

## 2013-10-12 MED ORDER — LORAZEPAM 1 MG PO TABS: 1.0000 mg | ORAL_TABLET | Freq: Every day | ORAL | Status: DC | PRN

## 2013-10-12 NOTE — Progress Notes (Signed)
Pre visit review using our clinic review tool, if applicable. No additional management support is needed unless otherwise documented below in the visit note. 

## 2013-10-12 NOTE — Addendum Note (Signed)
Addended by: Ewing Schlein on: 10/12/2013 01:10 PM   Modules accepted: Orders

## 2013-10-12 NOTE — Patient Instructions (Addendum)
Preventive Care for Adults, Female A healthy lifestyle and preventive care can promote health and wellness. Preventive health guidelines for women include the following key practices.  A routine yearly physical is a good way to check with your health care provider about your health and preventive screening. It is a chance to share any concerns and updates on your health and to receive a thorough exam.  Visit your dentist for a routine exam and preventive care every 6 months. Brush your teeth twice a day and floss once a day. Good oral hygiene prevents tooth decay and gum disease.  The frequency of eye exams is based on your age, health, family medical history, use of contact lenses, and other factors. Follow your health care provider's recommendations for frequency of eye exams.  Eat a healthy diet. Foods like vegetables, fruits, whole grains, low-fat dairy products, and lean protein foods contain the nutrients you need without too many calories. Decrease your intake of foods high in solid fats, added sugars, and salt. Eat the right amount of calories for you.Get information about a proper diet from your health care provider, if necessary.  Regular physical exercise is one of the most important things you can do for your health. Most adults should get at least 150 minutes of moderate-intensity exercise (any activity that increases your heart rate and causes you to sweat) each week. In addition, most adults need muscle-strengthening exercises on 2 or more days a week.  Maintain a healthy weight. The body mass index (BMI) is a screening tool to identify possible weight problems. It provides an estimate of body fat based on height and weight. Your health care provider can find your BMI, and can help you achieve or maintain a healthy weight.For adults 20 years and older:  A BMI below 18.5 is considered underweight.  A BMI of 18.5 to 24.9 is normal.  A BMI of 25 to 29.9 is considered overweight.  A  BMI of 30 and above is considered obese.  Maintain normal blood lipids and cholesterol levels by exercising and minimizing your intake of saturated fat. Eat a balanced diet with plenty of fruit and vegetables. Blood tests for lipids and cholesterol should begin at age 62 and be repeated every 5 years. If your lipid or cholesterol levels are high, you are over 50, or you are at high risk for heart disease, you may need your cholesterol levels checked more frequently.Ongoing high lipid and cholesterol levels should be treated with medicines if diet and exercise are not working.  If you smoke, find out from your health care provider how to quit. If you do not use tobacco, do not start.  Lung cancer screening is recommended for adults aged 36 80 years who are at high risk for developing lung cancer because of a history of smoking. A yearly low-dose CT scan of the lungs is recommended for people who have at least a 30-pack-year history of smoking and are a current smoker or have quit within the past 15 years. A pack year of smoking is smoking an average of 1 pack of cigarettes a day for 1 year (for example: 1 pack a day for 30 years or 2 packs a day for 15 years). Yearly screening should continue until the smoker has stopped smoking for at least 15 years. Yearly screening should be stopped for people who develop a health problem that would prevent them from having lung cancer treatment.  If you are pregnant, do not drink alcohol. If you  are breastfeeding, be very cautious about drinking alcohol. If you are not pregnant and choose to drink alcohol, do not have more than 1 drink per day. One drink is considered to be 12 ounces (355 mL) of beer, 5 ounces (148 mL) of wine, or 1.5 ounces (44 mL) of liquor.  Avoid use of street drugs. Do not share needles with anyone. Ask for help if you need support or instructions about stopping the use of drugs.  High blood pressure causes heart disease and increases the risk  of stroke. Your blood pressure should be checked at least every 1 to 2 years. Ongoing high blood pressure should be treated with medicines if weight loss and exercise do not work.  If you are 39 53 years old, ask your health care provider if you should take aspirin to prevent strokes.  Diabetes screening involves taking a blood sample to check your fasting blood sugar level. This should be done once every 3 years, after age 56, if you are within normal weight and without risk factors for diabetes. Testing should be considered at a younger age or be carried out more frequently if you are overweight and have at least 1 risk factor for diabetes.  Breast cancer screening is essential preventive care for women. You should practice "breast self-awareness." This means understanding the normal appearance and feel of your breasts and may include breast self-examination. Any changes detected, no matter how small, should be reported to a health care provider. Women in their 40s and 30s should have a clinical breast exam (CBE) by a health care provider as part of a regular health exam every 1 to 3 years. After age 28, women should have a CBE every year. Starting at age 72, women should consider having a mammogram (breast X-ray test) every year. Women who have a family history of breast cancer should talk to their health care provider about genetic screening. Women at a high risk of breast cancer should talk to their health care providers about having an MRI and a mammogram every year.  Breast cancer gene (BRCA)-related cancer risk assessment is recommended for women who have family members with BRCA-related cancers. BRCA-related cancers include breast, ovarian, tubal, and peritoneal cancers. Having family members with these cancers may be associated with an increased risk for harmful changes (mutations) in the breast cancer genes BRCA1 and BRCA2. Results of the assessment will determine the need for genetic counseling  and BRCA1 and BRCA2 testing.  The Pap test is a screening test for cervical cancer. A Pap test can show cell changes on the cervix that might become cervical cancer if left untreated. A Pap test is a procedure in which cells are obtained and examined from the lower end of the uterus (cervix).  Women should have a Pap test starting at age 59.  Between ages 42 and 13, Pap tests should be repeated every 2 years.  Beginning at age 53, you should have a Pap test every 3 years as long as the past 3 Pap tests have been normal.  Some women have medical problems that increase the chance of getting cervical cancer. Talk to your health care provider about these problems. It is especially important to talk to your health care provider if a new problem develops soon after your last Pap test. In these cases, your health care provider may recommend more frequent screening and Pap tests.  The above recommendations are the same for women who have or have not gotten the vaccine  for human papillomavirus (HPV).  If you had a hysterectomy for a problem that was not cancer or a condition that could lead to cancer, then you no longer need Pap tests. Even if you no longer need a Pap test, a regular exam is a good idea to make sure no other problems are starting.  If you are between ages 58 and 10 years, and you have had normal Pap tests going back 10 years, you no longer need Pap tests. Even if you no longer need a Pap test, a regular exam is a good idea to make sure no other problems are starting.  If you have had past treatment for cervical cancer or a condition that could lead to cancer, you need Pap tests and screening for cancer for at least 20 years after your treatment.  If Pap tests have been discontinued, risk factors (such as a new sexual partner) need to be reassessed to determine if screening should be resumed.  The HPV test is an additional test that may be used for cervical cancer screening. The HPV test  looks for the virus that can cause the cell changes on the cervix. The cells collected during the Pap test can be tested for HPV. The HPV test could be used to screen women aged 67 years and older, and should be used in women of any age who have unclear Pap test results. After the age of 65, women should have HPV testing at the same frequency as a Pap test.  Colorectal cancer can be detected and often prevented. Most routine colorectal cancer screening begins at the age of 25 years and continues through age 66 years. However, your health care provider may recommend screening at an earlier age if you have risk factors for colon cancer. On a yearly basis, your health care provider may provide home test kits to check for hidden blood in the stool. Use of a small camera at the end of a tube, to directly examine the colon (sigmoidoscopy or colonoscopy), can detect the earliest forms of colorectal cancer. Talk to your health care provider about this at age 79, when routine screening begins. Direct exam of the colon should be repeated every 5 10 years through age 47 years, unless early forms of pre-cancerous polyps or small growths are found.  People who are at an increased risk for hepatitis B should be screened for this virus. You are considered at high risk for hepatitis B if:  You were born in a country where hepatitis B occurs often. Talk with your health care provider about which countries are considered high risk.  Your parents were born in a high-risk country and you have not received a shot to protect against hepatitis B (hepatitis B vaccine).  You have HIV or AIDS.  You use needles to inject street drugs.  You live with, or have sex with, someone who has Hepatitis B.  You get hemodialysis treatment.  You take certain medicines for conditions like cancer, organ transplantation, and autoimmune conditions.  Hepatitis C blood testing is recommended for all people born from [birth year] through 1965 and  any individual with known risks for hepatitis C.  Practice safe sex. Use condoms and avoid high-risk sexual practices to reduce the spread of sexually transmitted infections (STIs). STIs include gonorrhea, chlamydia, syphilis, trichomonas, herpes, HPV, and human immunodeficiency virus (HIV). Herpes, HIV, and HPV are viral illnesses that have no cure. They can result in disability, cancer, and death. Sexually active women aged 66  years and younger should be checked for chlamydia. Older women with new or multiple partners should also be tested for chlamydia. Testing for other STIs is recommended if you are sexually active and at increased risk.  Osteoporosis is a disease in which the bones lose minerals and strength with aging. This can result in serious bone fractures or breaks. The risk of osteoporosis can be identified using a bone density scan. Women ages 18 years and over and women at risk for fractures or osteoporosis should discuss screening with their health care providers. Ask your health care provider whether you should take a calcium supplement or vitamin D to reduce the rate of osteoporosis.  Menopause can be associated with physical symptoms and risks. Hormone replacement therapy is available to decrease symptoms and risks. You should talk to your health care provider about whether hormone replacement therapy is right for you.  Use sunscreen. Apply sunscreen liberally and repeatedly throughout the day. You should seek shade when your shadow is shorter than you. Protect yourself by wearing long sleeves, pants, a wide-brimmed hat, and sunglasses year round, whenever you are outdoors.  Once a month, do a whole body skin exam, using a mirror to look at the skin on your back. Tell your health care provider of new moles, moles that have irregular borders, moles that are larger than a pencil eraser, or moles that have changed in shape or color.  Stay current with required vaccines  (immunizations).  Influenza vaccine. All adults should be immunized every year.  Tetanus, diphtheria, and acellular pertussis (Td, Tdap) vaccine. Pregnant women should receive 1 dose of Tdap vaccine during each pregnancy. The dose should be obtained regardless of the length of time since the last dose. Immunization is preferred during the 27th 36th week of gestation. An adult who has not previously received Tdap or who does not know her vaccine status should receive 1 dose of Tdap. This initial dose should be followed by tetanus and diphtheria toxoids (Td) booster doses every 10 years. Adults with an unknown or incomplete history of completing a 3-dose immunization series with Td-containing vaccines should begin or complete a primary immunization series including a Tdap dose. Adults should receive a Td booster every 10 years.  Varicella vaccine. An adult without evidence of immunity to varicella should receive 2 doses or a second dose if she has previously received 1 dose. Pregnant females who do not have evidence of immunity should receive the first dose after pregnancy. This first dose should be obtained before leaving the health care facility. The second dose should be obtained 4 8 weeks after the first dose.  Human papillomavirus (HPV) vaccine. Females aged 9 26 years who have not received the vaccine previously should obtain the 3-dose series. The vaccine is not recommended for use in pregnant females. However, pregnancy testing is not needed before receiving a dose. If a female is found to be pregnant after receiving a dose, no treatment is needed. In that case, the remaining doses should be delayed until after the pregnancy. Immunization is recommended for any person with an immunocompromised condition through the age of 51 years if she did not get any or all doses earlier. During the 3-dose series, the second dose should be obtained 4 8 weeks after the first dose. The third dose should be obtained  24 weeks after the first dose and 16 weeks after the second dose.  Zoster vaccine. One dose is recommended for adults aged 57 years or older unless certain  conditions are present.  Measles, mumps, and rubella (MMR) vaccine. Adults born before [birth year] generally are considered immune to measles and mumps. Adults born in [birth year] or later should have 1 or more doses of MMR vaccine unless there is a contraindication to the vaccine or there is laboratory evidence of immunity to each of the three diseases. A routine second dose of MMR vaccine should be obtained at least 28 days after the first dose for students attending postsecondary schools, health care workers, or international travelers. People who received inactivated measles vaccine or an unknown type of measles vaccine during 1963 1967 should receive 2 doses of MMR vaccine. People who received inactivated mumps vaccine or an unknown type of mumps vaccine before 1979 and are at high risk for mumps infection should consider immunization with 2 doses of MMR vaccine. For females of childbearing age, rubella immunity should be determined. If there is no evidence of immunity, females who are not pregnant should be vaccinated. If there is no evidence of immunity, females who are pregnant should delay immunization until after pregnancy. Unvaccinated health care workers born before [birth year] who lack laboratory evidence of measles, mumps, or rubella immunity or laboratory confirmation of disease should consider measles and mumps immunization with 2 doses of MMR vaccine or rubella immunization with 1 dose of MMR vaccine.  Pneumococcal 13-valent conjugate (PCV13) vaccine. When indicated, a person who is uncertain of her immunization history and has no record of immunization should receive the PCV13 vaccine. An adult aged 42 years or older who has certain medical conditions and has not been previously immunized should receive 1 dose of PCV13 vaccine. This PCV13 should be followed  with a dose of pneumococcal polysaccharide (PPSV23) vaccine. The PPSV23 vaccine dose should be obtained at least 8 weeks after the dose of PCV13 vaccine. An adult aged 4 years or older who has certain medical conditions and previously received 1 or more doses of PPSV23 vaccine should receive 1 dose of PCV13. The PCV13 vaccine dose should be obtained 1 or more years after the last PPSV23 vaccine dose.  Pneumococcal polysaccharide (PPSV23) vaccine. When PCV13 is also indicated, PCV13 should be obtained first. All adults aged 27 years and older should be immunized. An adult younger than age 33 years who has certain medical conditions should be immunized. Any person who resides in a nursing home or long-term care facility should be immunized. An adult smoker should be immunized. People with an immunocompromised condition and certain other conditions should receive both PCV13 and PPSV23 vaccines. People with human immunodeficiency virus (HIV) infection should be immunized as soon as possible after diagnosis. Immunization during chemotherapy or radiation therapy should be avoided. Routine use of PPSV23 vaccine is not recommended for American Indians, [place] Natives, or people younger than 65 years unless there are medical conditions that require PPSV23 vaccine. When indicated, people who have unknown immunization and have no record of immunization should receive PPSV23 vaccine. One-time revaccination 5 years after the first dose of PPSV23 is recommended for people aged 13 64 years who have chronic kidney failure, nephrotic syndrome, asplenia, or immunocompromised conditions. People who received 1 2 doses of PPSV23 before age 66 years should receive another dose of PPSV23 vaccine at age 27 years or later if at least 5 years have passed since the previous dose. Doses of PPSV23 are not needed for people immunized with PPSV23 at or after age 33 years.  Meningococcal vaccine. Adults with asplenia or persistent complement  component deficiencies should receive 2  doses of quadrivalent meningococcal conjugate (MenACWY-D) vaccine. The doses should be obtained at least 2 months apart. Microbiologists working with certain meningococcal bacteria, [place] recruits, people at risk during an outbreak, and people who travel to or live in countries with a high rate of meningitis should be immunized. A first-year college student up through age 49 years who is living in a residence hall should receive a dose if she did not receive a dose on or after her 16th birthday. Adults who have certain high-risk conditions should receive one or more doses of vaccine.  Hepatitis A vaccine. Adults who wish to be protected from this disease, have certain high-risk conditions, work with hepatitis A-infected animals, work in hepatitis A research labs, or travel to or work in countries with a high rate of hepatitis A should be immunized. Adults who were previously unvaccinated and who anticipate close contact with an international adoptee during the first 60 days after arrival in the [place] from a country with a high rate of hepatitis A should be immunized.  Hepatitis B vaccine. Adults who wish to be protected from this disease, have certain high-risk conditions, may be exposed to blood or other infectious body fluids, are household contacts or sex partners of hepatitis B positive people, are clients or workers in certain care facilities, or travel to or work in countries with a high rate of hepatitis B should be immunized.  Haemophilus influenzae type b (Hib) vaccine. A previously unvaccinated person with asplenia or sickle cell disease or having a scheduled splenectomy should receive 1 dose of Hib vaccine. Regardless of previous immunization, a recipient of a hematopoietic stem cell transplant should receive a 3-dose series 6 12 months after her successful transplant. Hib vaccine is not recommended for adults with HIV infection. Preventive  Services / Frequency Ages 24 to 39years  Blood pressure check.** / Every 1 to 2 years.  Lipid and cholesterol check.** / Every 5 years beginning at age 66.  Clinical breast exam.** / Every 3 years for women in their 12s and 24s.  BRCA-related cancer risk assessment.** / For women who have family members with a BRCA-related cancer (breast, ovarian, tubal, or peritoneal cancers).  Pap test.** / Every 2 years from ages 31 through 69. Every 3 years starting at age 64 through age 76 or 89 with a history of 3 consecutive normal Pap tests.  HPV screening.** / Every 3 years from ages 10 through ages 10 to 96 with a history of 3 consecutive normal Pap tests.  Hepatitis C blood test.** / For any individual with known risks for hepatitis C.  Skin self-exam. / Monthly.  Influenza vaccine. / Every year.  Tetanus, diphtheria, and acellular pertussis (Tdap, Td) vaccine.** / Consult your health care provider. Pregnant women should receive 1 dose of Tdap vaccine during each pregnancy. 1 dose of Td every 10 years.  Varicella vaccine.** / Consult your health care provider. Pregnant females who do not have evidence of immunity should receive the first dose after pregnancy.  HPV vaccine. / 3 doses over 6 months, if 90 and younger. The vaccine is not recommended for use in pregnant females. However, pregnancy testing is not needed before receiving a dose.  Measles, mumps, rubella (MMR) vaccine.** / You need at least 1 dose of MMR if you were born in 1957 or later. You may also need a 2nd dose. For females of childbearing age, rubella immunity should be determined. If there is no evidence of immunity, females who are not  pregnant should be vaccinated. If there is no evidence of immunity, females who are pregnant should delay immunization until after pregnancy.  Pneumococcal 13-valent conjugate (PCV13) vaccine.** / Consult your health care provider.  Pneumococcal polysaccharide (PPSV23) vaccine.** / 1 to 2  doses if you smoke cigarettes or if you have certain conditions.  Meningococcal vaccine.** / 1 dose if you are age 88 to 6 years and a Market researcher living in a residence hall, or have one of several medical conditions, you need to get vaccinated against meningococcal disease. You may also need additional booster doses.  Hepatitis A vaccine.** / Consult your health care provider.  Hepatitis B vaccine.** / Consult your health care provider.  Haemophilus influenzae type b (Hib) vaccine.** / Consult your health care provider. Ages 23 to 64years  Blood pressure check.** / Every 1 to 2 years.  Lipid and cholesterol check.** / Every 5 years beginning at age 20 years.  Lung cancer screening. / Every year if you are aged 51 80 years and have a 30-pack-year history of smoking and currently smoke or have quit within the past 15 years. Yearly screening is stopped once you have quit smoking for at least 15 years or develop a health problem that would prevent you from having lung cancer treatment.  Clinical breast exam.** / Every year after age 8 years.  BRCA-related cancer risk assessment.** / For women who have family members with a BRCA-related cancer (breast, ovarian, tubal, or peritoneal cancers).  Mammogram.** / Every year beginning at age 10 years and continuing for as long as you are in good health. Consult with your health care provider.  Pap test.** / Every 3 years starting at age 30 years through age 5 or 61 years with a history of 3 consecutive normal Pap tests.  HPV screening.** / Every 3 years from ages 39 years through ages 72 to 19 years with a history of 3 consecutive normal Pap tests.  Fecal occult blood test (FOBT) of stool. / Every year beginning at age 59 years and continuing until age 27 years. You may not need to do this test if you get a colonoscopy every 10 years.  Flexible sigmoidoscopy or colonoscopy.** / Every 5 years for a flexible sigmoidoscopy or every  10 years for a colonoscopy beginning at age 110 years and continuing until age 63 years.  Hepatitis C blood test.** / For all people born from [birth year] through 1965 and any individual with known risks for hepatitis C.  Skin self-exam. / Monthly.  Influenza vaccine. / Every year.  Tetanus, diphtheria, and acellular pertussis (Tdap/Td) vaccine.** / Consult your health care provider. Pregnant women should receive 1 dose of Tdap vaccine during each pregnancy. 1 dose of Td every 10 years.  Varicella vaccine.** / Consult your health care provider. Pregnant females who do not have evidence of immunity should receive the first dose after pregnancy.  Zoster vaccine.** / 1 dose for adults aged 46 years or older.  Measles, mumps, rubella (MMR) vaccine.** / You need at least 1 dose of MMR if you were born in 1957 or later. You may also need a 2nd dose. For females of childbearing age, rubella immunity should be determined. If there is no evidence of immunity, females who are not pregnant should be vaccinated. If there is no evidence of immunity, females who are pregnant should delay immunization until after pregnancy.  Pneumococcal 13-valent conjugate (PCV13) vaccine.** / Consult your health care provider.  Pneumococcal polysaccharide (PPSV23) vaccine.** / 1  to 2 doses if you smoke cigarettes or if you have certain conditions.  Meningococcal vaccine.** / Consult your health care provider.  Hepatitis A vaccine.** / Consult your health care provider.  Hepatitis B vaccine.** / Consult your health care provider.  Haemophilus influenzae type b (Hib) vaccine.** / Consult your health care provider. Ages 65 years and over  Blood pressure check.** / Every 1 to 2 years.  Lipid and cholesterol check.** / Every 5 years beginning at age 20 years.  Lung cancer screening. / Every year if you are aged 55 80 years and have a 30-pack-year history of smoking and currently smoke or have quit within the past 15 years.  Yearly screening is stopped once you have quit smoking for at least 15 years or develop a health problem that would prevent you from having lung cancer treatment.  Clinical breast exam.** / Every year after age 40 years.  BRCA-related cancer risk assessment.** / For women who have family members with a BRCA-related cancer (breast, ovarian, tubal, or peritoneal cancers).  Mammogram.** / Every year beginning at age 40 years and continuing for as long as you are in good health. Consult with your health care provider.  Pap test.** / Every 3 years starting at age 30 years through age 65 or 70 years with 3 consecutive normal Pap tests. Testing can be stopped between 65 and 70 years with 3 consecutive normal Pap tests and no abnormal Pap or HPV tests in the past 10 years.  HPV screening.** / Every 3 years from ages 30 years through ages 65 or 70 years with a history of 3 consecutive normal Pap tests. Testing can be stopped between 65 and 70 years with 3 consecutive normal Pap tests and no abnormal Pap or HPV tests in the past 10 years.  Fecal occult blood test (FOBT) of stool. / Every year beginning at age 50 years and continuing until age 75 years. You may not need to do this test if you get a colonoscopy every 10 years.  Flexible sigmoidoscopy or colonoscopy.** / Every 5 years for a flexible sigmoidoscopy or every 10 years for a colonoscopy beginning at age 50 years and continuing until age 75 years.  Hepatitis C blood test.** / For all people born from 1945 through 1965 and any individual with known risks for hepatitis C.  Osteoporosis screening.** / A one-time screening for women ages 65 years and over and women at risk for fractures or osteoporosis.  Skin self-exam. / Monthly.  Influenza vaccine. / Every year.  Tetanus, diphtheria, and acellular pertussis (Tdap/Td) vaccine.** / 1 dose of Td every 10 years.  Varicella vaccine.** / Consult your health care provider.  Zoster vaccine.** / 1  dose for adults aged 60 years or older.  Pneumococcal 13-valent conjugate (PCV13) vaccine.** / Consult your health care provider.  Pneumococcal polysaccharide (PPSV23) vaccine.** / 1 dose for all adults aged 65 years and older.  Meningococcal vaccine.** / Consult your health care provider.  Hepatitis A vaccine.** / Consult your health care provider.  Hepatitis B vaccine.** / Consult your health care provider.  Haemophilus influenzae type b (Hib) vaccine.** / Consult your health care provider. ** Family history and personal history of risk and conditions may change your health care provider's recommendations. Document Released: 08/21/2001 Document Revised: 04/15/2013 Document Reviewed: 11/20/2010 ExitCare Patient Information 2014 [employer].  Diet for Gastroesophageal Reflux Disease, Adult Reflux (acid reflux) is when acid from your stomach flows up into the esophagus. When acid comes in   contact with the esophagus, the acid causes irritation and soreness (inflammation) in the esophagus. When reflux happens often or so severely that it causes damage to the esophagus, it is called gastroesophageal reflux disease (GERD). Nutrition therapy can help ease the discomfort of GERD. FOODS OR DRINKS TO AVOID OR LIMIT  Smoking or chewing tobacco. Nicotine is one of the most potent stimulants to acid production in the gastrointestinal tract.  Caffeinated and decaffeinated coffee and black tea.  Regular or low-calorie carbonated beverages or energy drinks (caffeine-free carbonated beverages are allowed).   Strong spices, such as black pepper, white pepper, red pepper, cayenne, curry powder, and chili powder.  Peppermint or spearmint.  Chocolate.  High-fat foods, including meats and fried foods. Extra added fats including oils, butter, salad dressings, and nuts. Limit these to less than 8 tsp per day.  Fruits and vegetables if they are not tolerated, such as citrus fruits or  tomatoes.  Alcohol.  Any food that seems to aggravate your condition. If you have questions regarding your diet, call your caregiver or a registered dietitian. OTHER THINGS THAT MAY HELP GERD INCLUDE:   Eating your meals slowly, in a relaxed setting.  Eating 5 to 6 small meals per day instead of 3 large meals.  Eliminating food for a period of time if it causes distress.  Not lying down until 3 hours after eating a meal.  Keeping the head of your bed raised 6 to 9 inches (15 to 23 cm) by using a foam wedge or blocks under the legs of the bed. Lying flat may make symptoms worse.  Being physically active. Weight loss may be helpful in reducing reflux in overweight or obese adults.  Wear loose fitting clothing EXAMPLE MEAL PLAN This meal plan is approximately 2,000 calories based on ChooseMyPlate.gov meal planning guidelines. Breakfast   cup cooked oatmeal.  1 cup strawberries.  1 cup low-fat milk.  1 oz almonds. Snack  1 cup cucumber slices.  6 oz yogurt (made from low-fat or fat-free milk). Lunch  2 slice whole-wheat bread.  2 oz sliced turkey.  2 tsp mayonnaise.  1 cup blueberries.  1 cup snap peas. Snack  6 whole-wheat crackers.  1 oz string cheese. Dinner   cup brown rice.  1 cup mixed veggies.  1 tsp olive oil.  3 oz grilled fish. Document Released: 06/25/2005 Document Revised: 09/17/2011 Document Reviewed: 05/11/2011 ExitCare Patient Information 2014 [employer].   

## 2013-10-12 NOTE — Telephone Encounter (Signed)
Unable to reach pre visit.  

## 2013-10-12 NOTE — Progress Notes (Signed)
Subjective:     [name] is a 53 y.o. female and is here for a comprehensive physical exam. The patient reports problems - gerd, feels like food is getting stuck .  History   Social History  . Marital Status: Married    Spouse Name: N/A    Number of Children: N/A  . Years of Education: N/A   Occupational History  . teachers assistant [place] History Main Topics  . Smoking status: Never Smoker   . Smokeless tobacco: Never Used  . Alcohol Use: No  . Drug Use: No  . Sexual Activity: Yes    Partners: Male   Other Topics Concern  . Not on file   Social History Narrative   Exercise--walk, daily   Health Maintenance  Topic Date Due  . Influenza Vaccine  02/06/2014  . Pap Smear  05/29/2014  . Mammogram  04/23/2015  . Tetanus/tdap  03/20/2017  . Colonoscopy  09/26/2022    The following portions of the patient's history were reviewed and updated as appropriate:  She  has a past medical history of Gestational HTN; Melanoma; Asthma; Heart murmur; Hyperlipidemia; Allergy; Anemia; Anxiety; and GERD (gastroesophageal reflux disease). She  does not have any pertinent problems on file. She  has past surgical history that includes moles removed; nasal polyps; and Cholecystectomy. Her family history includes Cancer in her maternal uncle and other; Diabetes in her maternal grandfather; Heart disease (age of onset: 33) in her maternal grandfather; Hyperlipidemia in her maternal grandfather, maternal grandmother, and mother; Hypertension in her mother; Stroke in her maternal grandmother; Stroke (age of onset: 76) in her mother; Thyroid disease in her mother. There is no history of Colon cancer, Rectal cancer, or Stomach cancer. She  reports that she has never smoked. She has never used smokeless tobacco. She reports that she does not drink alcohol or use illicit drugs. She has a current medication list which includes the following prescription(s): ibuprofen,  ibuprofen, loratadine, lorazepam, sumatriptan, and omeprazole. Current Outpatient Prescriptions on File Prior to Visit  Medication Sig Dispense Refill  . ibuprofen (ADVIL,MOTRIN) 200 MG tablet Take 200 mg by mouth every 6 (six) hours as needed for pain.      . Ibuprofen (MIDOL PO) Take 1 capsule by mouth 4 (four) times daily as needed.      . loratadine (CLARITIN) 10 MG tablet Take 10 mg by mouth daily.       No current facility-administered medications on file prior to visit.   She has No Known Allergies..  Review of Systems Review of Systems  Constitutional: Negative for activity change, appetite change and fatigue.  HENT: Negative for hearing loss, congestion, tinnitus and ear discharge.  dentist q31m Eyes: Negative for visual disturbance (see optho q1y -- vision corrected to 20/20 with glasses).  Respiratory: Negative for cough, chest tightness and shortness of breath.   Cardiovascular: Negative for chest pain, palpitations and leg swelling.  Gastrointestinal: Negative for abdominal pain, diarrhea, constipation and abdominal distention.  Genitourinary: Negative for urgency, frequency, decreased urine volume and difficulty urinating.  Musculoskeletal: Negative for back pain, arthralgias and gait problem.  Skin: Negative for color change, pallor and rash.  Neurological: Negative for dizziness, light-headedness, numbness and headaches.  Hematological: Negative for adenopathy. Does not bruise/bleed easily.  Psychiatric/Behavioral: Negative for suicidal ideas, confusion, sleep disturbance, self-injury, dysphoric mood, decreased concentration and agitation.       Objective:    BP 120/76  Pulse 81  Temp(Src) 97.5 F (  36.4 C) (Oral)  Ht 5\' 1"  (1.549 m)  Wt 134 lb (60.782 kg)  BMI 25.33 kg/m2  SpO2 99% General appearance: alert, cooperative, appears stated age and no distress Head: Normocephalic, without obvious abnormality, atraumatic Eyes: conjunctivae/corneas clear. PERRL,  EOM's intact. Fundi benign. Ears: normal TM's and external ear canals both ears Nose: Nares normal. Septum midline. Mucosa normal. No drainage or sinus tenderness. Throat: lips, mucosa, and tongue normal; teeth and gums normal Neck: no adenopathy, no carotid bruit, no JVD, supple, symmetrical, trachea midline and thyroid not enlarged, symmetric, no tenderness/mass/nodules Back: symmetric, no curvature. ROM normal. No CVA tenderness. Lungs: clear to auscultation bilaterally Breasts: normal appearance, no masses or tenderness Heart: regular rate and rhythm, S1, S2 normal, no murmur, click, rub or gallop Abdomen: soft, non-tender; bowel sounds normal; no masses,  no organomegaly Pelvic: external genitalia normal, no adnexal masses or tenderness, no cervical motion tenderness, rectovaginal septum normal, uterus normal size, shape, and consistency, vagina normal without discharge and + cervical polyp--- removed with ring forceps and sent to path--pap done Rectal -heme neg brown stool Extremities: extremities normal, atraumatic, no cyanosis or edema Pulses: 2+ and symmetric Skin: Skin color, texture, turgor normal. No rashes or lesions Lymph nodes: Cervical, supraclavicular, and axillary nodes normal. Neurologic: Alert and oriented X 3, normal strength and tone. Normal symmetric reflexes. Normal coordination and gait Psych-- no depression, no anxiety      Assessment:    Healthy female exam.      Plan:  ghm utd Check labs   See After Visit Summary for Counseling Recommendations   1. Anxiety  - LORazepam (ATIVAN) 1 MG tablet; Take 1 tablet (1 mg total) by mouth daily as needed.  Dispense: 90 tablet; Refill: 0  2. Other dysphagia prilosec daily - Ambulatory referral to Gastroenterology  3. Migraines Stable  - SUMAtriptan (IMITREX) 100 MG tablet; TAKE BY MOUTH AS DIRECTED  Dispense: 12 tablet; Refill: 5  4. GERD (gastroesophageal reflux disease) Refer to GI - omeprazole (PRILOSEC) 40  MG capsule; Take 1 capsule (40 mg total) by mouth daily.  Dispense: 30 capsule; Refill: 3  5. Preventative health care

## 2013-10-13 LAB — POCT URINALYSIS DIPSTICK
BILIRUBIN UA: NEGATIVE
Glucose, UA: NEGATIVE
Ketones, UA: NEGATIVE
NITRITE UA: NEGATIVE
Protein, UA: NEGATIVE
Spec Grav, UA: <=1.005
Urobilinogen, UA: 0.2
pH, UA: 8

## 2013-10-13 NOTE — Addendum Note (Signed)
Addended by: Modena Morrow D on: 10/13/2013 04:48 PM   Modules accepted: Orders

## 2013-10-14 ENCOUNTER — Ambulatory Visit (HOSPITAL_COMMUNITY): Payer: BC Managed Care – PPO

## 2013-10-15 ENCOUNTER — Ambulatory Visit (HOSPITAL_COMMUNITY)
Admission: RE | Admit: 2013-10-15 | Discharge: 2013-10-15 | Disposition: A | Discharge status: Home / Self Care | Payer: BC Managed Care – PPO | Source: Ambulatory Visit | Attending: Family Medicine | Admitting: Family Medicine

## 2013-10-15 DIAGNOSIS — N841 Polyp of cervix uteri: Secondary | ICD-10-CM

## 2013-10-15 DIAGNOSIS — N958 Other specified menopausal and perimenopausal disorders: Secondary | ICD-10-CM | POA: Insufficient documentation

## 2013-10-16 ENCOUNTER — Other Ambulatory Visit: Payer: Self-pay

## 2013-10-16 LAB — URINE CULTURE

## 2013-10-16 MED ORDER — CIPROFLOXACIN HCL 500 MG PO TABS: 500.0000 mg | ORAL_TABLET | Freq: Two times a day (BID) | ORAL | Status: DC

## 2013-11-12 ENCOUNTER — Ambulatory Visit: Payer: BC Managed Care – PPO | Admitting: Family Medicine

## 2013-12-03 ENCOUNTER — Ambulatory Visit: Payer: BC Managed Care – PPO | Admitting: Internal Medicine

## 2013-12-27 ENCOUNTER — Other Ambulatory Visit: Payer: Self-pay | Admitting: Family Medicine

## 2014-02-01 ENCOUNTER — Ambulatory Visit: Payer: BC Managed Care – PPO | Admitting: Internal Medicine

## 2014-03-22 ENCOUNTER — Telehealth: Payer: Self-pay | Admitting: Family Medicine

## 2014-03-22 NOTE — Telephone Encounter (Signed)
Received PA paperwork for sumatriptan, forward to nurse

## 2014-03-24 ENCOUNTER — Other Ambulatory Visit: Payer: Self-pay

## 2014-03-24 DIAGNOSIS — Z1231 Encounter for screening mammogram for malignant neoplasm of breast: Secondary | ICD-10-CM

## 2014-03-26 ENCOUNTER — Other Ambulatory Visit: Payer: Self-pay | Admitting: Family Medicine

## 2014-03-31 ENCOUNTER — Encounter: Payer: Self-pay | Admitting: Internal Medicine

## 2014-03-31 ENCOUNTER — Ambulatory Visit (INDEPENDENT_AMBULATORY_CARE_PROVIDER_SITE_OTHER): Payer: BC Managed Care – PPO | Admitting: Internal Medicine

## 2014-03-31 VITALS — BP 126/82 | HR 76 | Ht 60.0 in | Wt 132.1 lb

## 2014-03-31 DIAGNOSIS — K219 Gastro-esophageal reflux disease without esophagitis: Secondary | ICD-10-CM

## 2014-03-31 DIAGNOSIS — R1319 Other dysphagia: Secondary | ICD-10-CM

## 2014-03-31 DIAGNOSIS — R1310 Dysphagia, unspecified: Secondary | ICD-10-CM

## 2014-03-31 DIAGNOSIS — R1314 Dysphagia, pharyngoesophageal phase: Secondary | ICD-10-CM

## 2014-03-31 NOTE — Progress Notes (Signed)
   Subjective:    Patient ID: [name], female    DOB: [birth year], 53 y.o.   MRN: [id number]  HPI  The patient is here because of intermittent solid food dysphagia that began about 2 years ago. It occurs suddenly without warning. The most common culprit is chicken. History of reflux symptoms, she is using omeprazole 40 mg intermittently but not every day. She does not sleep on her left side when she lies down at night she won't reflux at night. There is no unintentional weight loss or bleeding. He drinks two regular size glasses of a CT daily that certainly caffeine she is not a smoker. Medications, allergies, past medical history, past surgical history, family history and social history are reviewed and updated in the EMR.  Review of Systems As per HPI All other ROS negative    Objective:   Physical Exam General:  Well-developed, well-nourished and in no acute distress Eyes:  anicteric. ENT:   Mouth and posterior pharynx free of lesions.  Neck:   supple w/o thyromegaly or mass.  Lungs: Clear to auscultation bilaterally. Heart:  S1S2, no rubs, murmurs, gallops. Abdomen:  soft, non-tender, no hepatosplenomegaly, hernia, or mass and BS+.  Lymph:  no cervical or supraclavicular adenopathy. Extremities:   no edema Skin   congenital nevus LUQ Neuro:  A&O x 3.  Psych:  appropriate mood and  Affect.   Data Reviewed:  Lab Results  Component Value Date   WBC 5.1 10/12/2013   HGB 12.0 10/12/2013   HCT 36.4 10/12/2013   MCV 93.2 10/12/2013   PLT 324.0 10/12/2013         Assessment & Plan:   1. Esophageal dysphagia   2. Gastroesophageal reflux disease, esophagitis presence not specified    This sounds like dysphagia due to an esophageal stricture or ring from reflux disease. Endoscopy with esophageal dilation would be appropriate. I've made this recommendation.The risks and benefits as well as alternatives of endoscopic procedure(s) have been discussed and reviewed. All questions  answered. The patient agrees to proceed. She will take her omeprazole on a daily basis we will refill that.  I appreciate the opportunity to care for this patient. CC: Garnet Koyanagi, DO

## 2014-03-31 NOTE — Patient Instructions (Addendum)

## 2014-04-02 DIAGNOSIS — K219 Gastro-esophageal reflux disease without esophagitis: Secondary | ICD-10-CM | POA: Insufficient documentation

## 2014-04-02 DIAGNOSIS — R1319 Other dysphagia: Secondary | ICD-10-CM | POA: Insufficient documentation

## 2014-04-02 DIAGNOSIS — R1310 Dysphagia, unspecified: Secondary | ICD-10-CM | POA: Insufficient documentation

## 2014-04-07 ENCOUNTER — Encounter: Payer: Self-pay | Admitting: Internal Medicine

## 2014-04-08 NOTE — Telephone Encounter (Signed)
Additional information requested from [employer]. Forms filled out and faxed back to [employer]. Awaiting determination. JG//CMA

## 2014-04-12 NOTE — Telephone Encounter (Signed)
PA approved effective 03/18/2014 through 04/08/2015. Case ID: [id number]. Approval letter sent for scanning. JG//CMA

## 2014-04-15 ENCOUNTER — Encounter: Payer: BC Managed Care – PPO | Admitting: Internal Medicine

## 2014-04-16 ENCOUNTER — Ambulatory Visit: Payer: BC Managed Care – PPO | Admitting: Family Medicine

## 2014-04-23 ENCOUNTER — Other Ambulatory Visit: Payer: Self-pay

## 2014-04-26 ENCOUNTER — Encounter: Payer: Self-pay | Admitting: Internal Medicine

## 2014-04-26 ENCOUNTER — Ambulatory Visit (AMBULATORY_SURGERY_CENTER): Payer: BC Managed Care – PPO | Admitting: Internal Medicine

## 2014-04-26 ENCOUNTER — Ambulatory Visit: Payer: BC Managed Care – PPO

## 2014-04-26 VITALS — BP 139/71 | HR 76 | Temp 96.7°F | Resp 14 | Ht 60.0 in | Wt 132.0 lb

## 2014-04-26 DIAGNOSIS — K222 Esophageal obstruction: Secondary | ICD-10-CM | POA: Insufficient documentation

## 2014-04-26 DIAGNOSIS — K449 Diaphragmatic hernia without obstruction or gangrene: Secondary | ICD-10-CM

## 2014-04-26 DIAGNOSIS — R1314 Dysphagia, pharyngoesophageal phase: Secondary | ICD-10-CM

## 2014-04-26 MED ORDER — SODIUM CHLORIDE 0.9 % IV SOLN: 500.0000 mL | INTRAVENOUS | Status: DC

## 2014-04-26 NOTE — Patient Instructions (Addendum)
I found and dilated a stricture (also called a ring) where the esophagus and stomach meet. This is where the food has been lodging. You also have a small hiatal hernia - stomach moves from abdomen into chest.  Please take omeprazole every day to reduce need for dilation in future.  I appreciate the opportunity to care for you. Gatha Mayer, MD, FACG  YOU HAD AN ENDOSCOPIC PROCEDURE TODAY AT [facility]: Refer to the procedure report that was given to you for any specific questions about what was found during the examination.  If the procedure report does not answer your questions, please call your gastroenterologist to clarify.  If you requested that your care partner not be given the details of your procedure findings, then the procedure report has been included in a sealed envelope for you to review at your convenience later.  YOU SHOULD EXPECT: Some feelings of bloating in the abdomen. Passage of more gas than usual.  Walking can help get rid of the air that was put into your GI tract during the procedure and reduce the bloating. If you had a lower endoscopy (such as a colonoscopy or flexible sigmoidoscopy) you may notice spotting of blood in your stool or on the toilet paper. If you underwent a bowel prep for your procedure, then you may not have a normal bowel movement for a few days.  DIET:  Nothing to eat or drink until 5 pm. 5pm until 6 pm only clear liquids. After 6 pm until morning only soft foods. Resume your regular diet in am.  ACTIVITY: Your care partner should take you home directly after the procedure.  You should plan to take it easy, moving slowly for the rest of the day.  You can resume normal activity the day after the procedure however you should NOT DRIVE or use heavy machinery for 24 hours (because of the sedation medicines used during the test).    SYMPTOMS TO REPORT IMMEDIATELY: A gastroenterologist can be reached at any hour.  During normal  business hours, 8:30 AM to 5:00 PM Monday through Friday, call [phone].  After hours and on weekends, please call the GI answering service at [phone] who will take a message and have the physician on call contact you.   Following upper endoscopy (EGD)  Vomiting of blood or coffee ground material  New chest pain or pain under the shoulder blades  Painful or persistently difficult swallowing  New shortness of breath  Fever of 100F or higher  Black, tarry-looking stools  FOLLOW UP: If any biopsies were taken you will be contacted by phone or by letter within the next 1-3 weeks.  Call your gastroenterologist if you have not heard about the biopsies in 3 weeks.  Our staff will call the home number listed on your records the next business day following your procedure to check on you and address any questions or concerns that you may have at that time regarding the information given to you following your procedure. This is a courtesy call and so if there is no answer at the home number and we have not heard from you through the emergency physician on call, we will assume that you have returned to your regular daily activities without incident.  SIGNATURES/CONFIDENTIALITY: You and/or your care partner have signed paperwork which will be entered into your electronic medical record.  These signatures attest to the fact that that the information above on your After Visit Summary has  been reviewed and is understood.  Full responsibility of the confidentiality of this discharge information lies with you and/or your care-partner.

## 2014-04-26 NOTE — Progress Notes (Signed)
Called to room to assist during endoscopic procedure.  Patient ID and intended procedure confirmed with present staff. Received instructions for my participation in the procedure from the performing physician.  

## 2014-04-26 NOTE — Op Note (Addendum)
[place]  [address]. [place], [id number]   ENDOSCOPY PROCEDURE REPORT  PATIENT: [name], [name]  MR#: [id number] BIRTHDATE: [birth year] , 53  yrs. old GENDER: female ENDOSCOPIST: Gatha Mayer, MD, [facility] PROCEDURE DATE:  04/26/2014 PROCEDURE:  EGD w/ balloon dilation ASA CLASS:     Class II INDICATIONS:  dysphagia. MEDICATIONS: Propofol 200 mg IV and Monitored anesthesia care TOPICAL ANESTHETIC: none  DESCRIPTION OF PROCEDURE: After the risks benefits and alternatives of the procedure were thoroughly explained, informed consent was obtained.  The LB [id number] endoscope was introduced through the mouth and advanced to the second portion of the duodenum , Without limitations.  The instrument was slowly withdrawn as the mucosa was fully examined.    ESOPHAGUS: A Schatzki ring was found at the gastroesophageal junction.  Using a TTS-balloon the stricture was dilated 18,19 and 20 mm. Slight heme, good result.   STOMACH: A 4 cm hiatal hernia was noted.    Otherwise normal EGD  Retroflexed views revealed as previously described.     The scope was then withdrawn from the patient and the procedure completed.  COMPLICATIONS: There were no immediate complications.  ENDOSCOPIC IMPRESSION: 1.   Schatzki ring was found at the gastroesophageal junction; Using a TTS-balloon the stricture was dilated up to 20 mm 2.   4 cm hiatal hernia 3.   Otherwise normal EGD  RECOMMENDATIONS: 1.  Continue PPI - omeprazole every day 2.  Clear liquids until 5 PM  , then soft foods rest of day.  Resume prior diet tomorrow.   eSigned:  Gatha Mayer, MD, [facility] 04/26/2014 4:07 PM Revised: 04/26/2014 4:07 PM   CC: The Patient and Garnet Koyanagi, DO

## 2014-04-26 NOTE — Progress Notes (Signed)
A/ox3 pleased with MAC, report to Karen RN 

## 2014-04-27 ENCOUNTER — Telehealth: Payer: Self-pay

## 2014-04-27 NOTE — Telephone Encounter (Signed)
No answer, left voicemail

## 2014-05-03 ENCOUNTER — Ambulatory Visit
Admission: RE | Admit: 2014-05-03 | Discharge: 2014-05-03 | Disposition: A | Discharge status: Home / Self Care | Payer: BC Managed Care – PPO | Source: Ambulatory Visit

## 2014-05-03 ENCOUNTER — Encounter: Payer: Self-pay | Admitting: Family Medicine

## 2014-05-03 ENCOUNTER — Ambulatory Visit (INDEPENDENT_AMBULATORY_CARE_PROVIDER_SITE_OTHER): Payer: BC Managed Care – PPO | Admitting: Family Medicine

## 2014-05-03 VITALS — BP 134/81 | HR 72 | Temp 98.6°F | Wt 131.6 lb

## 2014-05-03 DIAGNOSIS — R350 Frequency of micturition: Secondary | ICD-10-CM

## 2014-05-03 DIAGNOSIS — K219 Gastro-esophageal reflux disease without esophagitis: Secondary | ICD-10-CM

## 2014-05-03 DIAGNOSIS — F419 Anxiety disorder, unspecified: Secondary | ICD-10-CM

## 2014-05-03 DIAGNOSIS — R319 Hematuria, unspecified: Secondary | ICD-10-CM

## 2014-05-03 DIAGNOSIS — G43809 Other migraine, not intractable, without status migrainosus: Secondary | ICD-10-CM

## 2014-05-03 DIAGNOSIS — Z1231 Encounter for screening mammogram for malignant neoplasm of breast: Secondary | ICD-10-CM

## 2014-05-03 LAB — POCT URINALYSIS DIPSTICK
BILIRUBIN UA: NEGATIVE
Glucose, UA: NEGATIVE
Ketones, UA: NEGATIVE
NITRITE UA: NEGATIVE
Spec Grav, UA: 1.02
Urobilinogen, UA: 2
pH, UA: 7

## 2014-05-03 MED ORDER — CIPROFLOXACIN HCL 250 MG PO TABS: 250.0000 mg | ORAL_TABLET | Freq: Two times a day (BID) | ORAL | Status: DC

## 2014-05-03 MED ORDER — SUMATRIPTAN SUCCINATE 100 MG PO TABS: ORAL_TABLET | ORAL | Status: DC

## 2014-05-03 NOTE — Patient Instructions (Signed)

## 2014-05-03 NOTE — Progress Notes (Signed)
  Subjective:    [name] is a 53 y.o. female who complains of dysuria and frequency. She has had symptoms for a few days. Patient also complains of no other symptoms. Patient denies no other symptoms. Patient does have a history of recurrent UTI. Patient does not have a history of pyelonephritis.   The following portions of the patient's history were reviewed and updated as appropriate: allergies, current medications, past family history, past medical history, past social history, past surgical history and problem list.  Review of Syst Pertinent items are noted in HPI.    Objective:    BP 134/81  Pulse 72  Temp(Src) 98.6 F (37 C) (Oral)  Wt 131 lb 9.6 oz (59.693 kg)  SpO2 97% General appearance: alert, cooperative, appears stated age and no distress Neurologic: Alert and oriented X 3, normal strength and tone. Normal symmetric reflexes. Normal coordination and gait  Laboratory:  Urine dipstick: mod for hemoglobin and mod for leukocyte esterase.   Micro exam: not done.    Assessment:    hematuria and urinary frequency     Plan:    Medications: ciprofloxacin. Maintain adequate hydration. Follow up if symptoms not improving, and as needed. recheck 2 weeks--- if cont refer to urology

## 2014-05-03 NOTE — Progress Notes (Signed)
Pre visit review using our clinic review tool, if applicable. No additional management support is needed unless otherwise documented below in the visit note. 

## 2014-05-04 ENCOUNTER — Encounter: Payer: Self-pay | Admitting: Family Medicine

## 2014-05-04 DIAGNOSIS — G43809 Other migraine, not intractable, without status migrainosus: Secondary | ICD-10-CM

## 2014-05-05 ENCOUNTER — Telehealth: Payer: Self-pay | Admitting: Family Medicine

## 2014-05-05 LAB — URINE CULTURE

## 2014-05-05 NOTE — Telephone Encounter (Signed)
Caller name: [facility]  Call back number: Pharmacy:  Reason for call:  Please redo PA for SUMAtriptan (IMITREX) 100 MG tablet.  Insurance states that it is not going thru for some reason.  Ref# [id number]

## 2014-05-05 NOTE — Telephone Encounter (Signed)
Called [employer] and got issue resolved. The prior authorization representative sent a test claim over to the pharmacy and it went through. JG//CMA

## 2014-05-17 ENCOUNTER — Other Ambulatory Visit (INDEPENDENT_AMBULATORY_CARE_PROVIDER_SITE_OTHER): Payer: BC Managed Care – PPO

## 2014-05-17 DIAGNOSIS — R319 Hematuria, unspecified: Secondary | ICD-10-CM

## 2014-05-17 DIAGNOSIS — R350 Frequency of micturition: Secondary | ICD-10-CM

## 2014-05-17 LAB — POCT URINALYSIS DIPSTICK
Bilirubin, UA: NEGATIVE
GLUCOSE UA: NEGATIVE
Ketones, UA: NEGATIVE
Leukocytes, UA: NEGATIVE
Nitrite, UA: POSITIVE
Spec Grav, UA: 1.015
Urobilinogen, UA: 0.2
pH, UA: 7.5

## 2014-05-19 LAB — URINE CULTURE
Colony Count: NO GROWTH
ORGANISM ID, BACTERIA: NO GROWTH

## 2014-08-30 ENCOUNTER — Other Ambulatory Visit: Payer: Self-pay | Admitting: Urology

## 2014-09-24 ENCOUNTER — Telehealth: Payer: Self-pay | Admitting: Family Medicine

## 2014-09-24 NOTE — Telephone Encounter (Signed)
pre visit letter sent °

## 2014-10-13 ENCOUNTER — Telehealth: Payer: Self-pay

## 2014-10-13 NOTE — Telephone Encounter (Signed)
Attempted pre visit call with no answer. LMTCB

## 2014-10-13 NOTE — Telephone Encounter (Signed)
Pre visit call/questionnaire completed.

## 2014-10-14 ENCOUNTER — Encounter: Payer: Self-pay | Admitting: Family Medicine

## 2014-10-14 ENCOUNTER — Ambulatory Visit (INDEPENDENT_AMBULATORY_CARE_PROVIDER_SITE_OTHER): Payer: BC Managed Care – PPO | Admitting: Family Medicine

## 2014-10-14 VITALS — BP 120/72 | HR 65 | Temp 97.7°F | Ht 61.0 in | Wt 130.8 lb

## 2014-10-14 DIAGNOSIS — Z0000 Encounter for general adult medical examination without abnormal findings: Secondary | ICD-10-CM

## 2014-10-14 DIAGNOSIS — R319 Hematuria, unspecified: Secondary | ICD-10-CM

## 2014-10-14 DIAGNOSIS — K219 Gastro-esophageal reflux disease without esophagitis: Secondary | ICD-10-CM

## 2014-10-14 DIAGNOSIS — F419 Anxiety disorder, unspecified: Secondary | ICD-10-CM | POA: Diagnosis not present

## 2014-10-14 DIAGNOSIS — G43809 Other migraine, not intractable, without status migrainosus: Secondary | ICD-10-CM

## 2014-10-14 LAB — POCT URINALYSIS DIPSTICK
Bilirubin, UA: NEGATIVE
GLUCOSE UA: NEGATIVE
Ketones, UA: NEGATIVE
Leukocytes, UA: NEGATIVE
NITRITE UA: NEGATIVE
PROTEIN UA: NEGATIVE
Spec Grav, UA: 1.025
Urobilinogen, UA: 2
pH, UA: 6

## 2014-10-14 MED ORDER — LORAZEPAM 1 MG PO TABS
1.0000 mg | ORAL_TABLET | Freq: Every day | ORAL | Status: DC | PRN
Start: 2014-10-14 — End: 2015-07-22

## 2014-10-14 MED ORDER — SUMATRIPTAN SUCCINATE 100 MG PO TABS: ORAL_TABLET | ORAL | Status: DC

## 2014-10-14 MED ORDER — OMEPRAZOLE 40 MG PO CPDR: 40.0000 mg | DELAYED_RELEASE_CAPSULE | Freq: Every day | ORAL | Status: DC

## 2014-10-14 NOTE — Progress Notes (Signed)
Pre visit review using our clinic review tool, if applicable. No additional management support is needed unless otherwise documented below in the visit note. 

## 2014-10-14 NOTE — Patient Instructions (Signed)
Preventive Care for Adults A healthy lifestyle and preventive care can promote health and wellness. Preventive health guidelines for women include the following key practices.  A routine yearly physical is a good way to check with your health care provider about your health and preventive screening. It is a chance to share any concerns and updates on your health and to receive a thorough exam.  Visit your dentist for a routine exam and preventive care every 6 months. Brush your teeth twice a day and floss once a day. Good oral hygiene prevents tooth decay and gum disease.  The frequency of eye exams is based on your age, health, family medical history, use of contact lenses, and other factors. Follow your health care provider's recommendations for frequency of eye exams.  Eat a healthy diet. Foods like vegetables, fruits, whole grains, low-fat dairy products, and lean protein foods contain the nutrients you need without too many calories. Decrease your intake of foods high in solid fats, added sugars, and salt. Eat the right amount of calories for you.Get information about a proper diet from your health care provider, if necessary.  Regular physical exercise is one of the most important things you can do for your health. Most adults should get at least 150 minutes of moderate-intensity exercise (any activity that increases your heart rate and causes you to sweat) each week. In addition, most adults need muscle-strengthening exercises on 2 or more days a week.  Maintain a healthy weight. The body mass index (BMI) is a screening tool to identify possible weight problems. It provides an estimate of body fat based on height and weight. Your health care provider can find your BMI and can help you achieve or maintain a healthy weight.For adults 20 years and older:  A BMI below 18.5 is considered underweight.  A BMI of 18.5 to 24.9 is normal.  A BMI of 25 to 29.9 is considered overweight.  A BMI of  30 and above is considered obese.  Maintain normal blood lipids and cholesterol levels by exercising and minimizing your intake of saturated fat. Eat a balanced diet with plenty of fruit and vegetables. Blood tests for lipids and cholesterol should begin at age 76 and be repeated every 5 years. If your lipid or cholesterol levels are high, you are over 50, or you are at high risk for heart disease, you may need your cholesterol levels checked more frequently.Ongoing high lipid and cholesterol levels should be treated with medicines if diet and exercise are not working.  If you smoke, find out from your health care provider how to quit. If you do not use tobacco, do not start.  Lung cancer screening is recommended for adults aged 22-80 years who are at high risk for developing lung cancer because of a history of smoking. A yearly low-dose CT scan of the lungs is recommended for people who have at least a 30-pack-year history of smoking and are a current smoker or have quit within the past 15 years. A pack year of smoking is smoking an average of 1 pack of cigarettes a day for 1 year (for example: 1 pack a day for 30 years or 2 packs a day for 15 years). Yearly screening should continue until the smoker has stopped smoking for at least 15 years. Yearly screening should be stopped for people who develop a health problem that would prevent them from having lung cancer treatment.  If you are pregnant, do not drink alcohol. If you are breastfeeding,  be very cautious about drinking alcohol. If you are not pregnant and choose to drink alcohol, do not have more than 1 drink per day. One drink is considered to be 12 ounces (355 mL) of beer, 5 ounces (148 mL) of wine, or 1.5 ounces (44 mL) of liquor.  Avoid use of street drugs. Do not share needles with anyone. Ask for help if you need support or instructions about stopping the use of drugs.  High blood pressure causes heart disease and increases the risk of  stroke. Your blood pressure should be checked at least every 1 to 2 years. Ongoing high blood pressure should be treated with medicines if weight loss and exercise do not work.  If you are 75-52 years old, ask your health care provider if you should take aspirin to prevent strokes.  Diabetes screening involves taking a blood sample to check your fasting blood sugar level. This should be done once every 3 years, after age 15, if you are within normal weight and without risk factors for diabetes. Testing should be considered at a younger age or be carried out more frequently if you are overweight and have at least 1 risk factor for diabetes.  Breast cancer screening is essential preventive care for women. You should practice "breast self-awareness." This means understanding the normal appearance and feel of your breasts and may include breast self-examination. Any changes detected, no matter how small, should be reported to a health care provider. Women in their 58s and 30s should have a clinical breast exam (CBE) by a health care provider as part of a regular health exam every 1 to 3 years. After age 16, women should have a CBE every year. Starting at age 53, women should consider having a mammogram (breast X-ray test) every year. Women who have a family history of breast cancer should talk to their health care provider about genetic screening. Women at a high risk of breast cancer should talk to their health care providers about having an MRI and a mammogram every year.  Breast cancer gene (BRCA)-related cancer risk assessment is recommended for women who have family members with BRCA-related cancers. BRCA-related cancers include breast, ovarian, tubal, and peritoneal cancers. Having family members with these cancers may be associated with an increased risk for harmful changes (mutations) in the breast cancer genes BRCA1 and BRCA2. Results of the assessment will determine the need for genetic counseling and  BRCA1 and BRCA2 testing.  Routine pelvic exams to screen for cancer are no longer recommended for nonpregnant women who are considered low risk for cancer of the pelvic organs (ovaries, uterus, and vagina) and who do not have symptoms. Ask your health care provider if a screening pelvic exam is right for you.  If you have had past treatment for cervical cancer or a condition that could lead to cancer, you need Pap tests and screening for cancer for at least 20 years after your treatment. If Pap tests have been discontinued, your risk factors (such as having a new sexual partner) need to be reassessed to determine if screening should be resumed. Some women have medical problems that increase the chance of getting cervical cancer. In these cases, your health care provider may recommend more frequent screening and Pap tests.  The HPV test is an additional test that may be used for cervical cancer screening. The HPV test looks for the virus that can cause the cell changes on the cervix. The cells collected during the Pap test can be  tested for HPV. The HPV test could be used to screen women aged 30 years and older, and should be used in women of any age who have unclear Pap test results. After the age of 30, women should have HPV testing at the same frequency as a Pap test.  Colorectal cancer can be detected and often prevented. Most routine colorectal cancer screening begins at the age of 50 years and continues through age 75 years. However, your health care provider may recommend screening at an earlier age if you have risk factors for colon cancer. On a yearly basis, your health care provider may provide home test kits to check for hidden blood in the stool. Use of a small camera at the end of a tube, to directly examine the colon (sigmoidoscopy or colonoscopy), can detect the earliest forms of colorectal cancer. Talk to your health care provider about this at age 50, when routine screening begins. Direct  exam of the colon should be repeated every 5-10 years through age 75 years, unless early forms of pre-cancerous polyps or small growths are found.  People who are at an increased risk for hepatitis B should be screened for this virus. You are considered at high risk for hepatitis B if:  You were born in a country where hepatitis B occurs often. Talk with your health care provider about which countries are considered high risk.  Your parents were born in a high-risk country and you have not received a shot to protect against hepatitis B (hepatitis B vaccine).  You have HIV or AIDS.  You use needles to inject street drugs.  You live with, or have sex with, someone who has hepatitis B.  You get hemodialysis treatment.  You take certain medicines for conditions like cancer, organ transplantation, and autoimmune conditions.  Hepatitis C blood testing is recommended for all people born from 1945 through 1965 and any individual with known risks for hepatitis C.  Practice safe sex. Use condoms and avoid high-risk sexual practices to reduce the spread of sexually transmitted infections (STIs). STIs include gonorrhea, chlamydia, syphilis, trichomonas, herpes, HPV, and human immunodeficiency virus (HIV). Herpes, HIV, and HPV are viral illnesses that have no cure. They can result in disability, cancer, and death.  You should be screened for sexually transmitted illnesses (STIs) including gonorrhea and chlamydia if:  You are sexually active and are younger than 24 years.  You are older than 24 years and your health care provider tells you that you are at risk for this type of infection.  Your sexual activity has changed since you were last screened and you are at an increased risk for chlamydia or gonorrhea. Ask your health care provider if you are at risk.  If you are at risk of being infected with HIV, it is recommended that you take a prescription medicine daily to prevent HIV infection. This is  called preexposure prophylaxis (PrEP). You are considered at risk if:  You are a heterosexual woman, are sexually active, and are at increased risk for HIV infection.  You take drugs by injection.  You are sexually active with a partner who has HIV.  Talk with your health care provider about whether you are at high risk of being infected with HIV. If you choose to begin PrEP, you should first be tested for HIV. You should then be tested every 3 months for as long as you are taking PrEP.  Osteoporosis is a disease in which the bones lose minerals and strength   with aging. This can result in serious bone fractures or breaks. The risk of osteoporosis can be identified using a bone density scan. Women ages 65 years and over and women at risk for fractures or osteoporosis should discuss screening with their health care providers. Ask your health care provider whether you should take a calcium supplement or vitamin D to reduce the rate of osteoporosis.  Menopause can be associated with physical symptoms and risks. Hormone replacement therapy is available to decrease symptoms and risks. You should talk to your health care provider about whether hormone replacement therapy is right for you.  Use sunscreen. Apply sunscreen liberally and repeatedly throughout the day. You should seek shade when your shadow is shorter than you. Protect yourself by wearing long sleeves, pants, a wide-brimmed hat, and sunglasses year round, whenever you are outdoors.  Once a month, do a whole body skin exam, using a mirror to look at the skin on your back. Tell your health care provider of new moles, moles that have irregular borders, moles that are larger than a pencil eraser, or moles that have changed in shape or color.  Stay current with required vaccines (immunizations).  Influenza vaccine. All adults should be immunized every year.  Tetanus, diphtheria, and acellular pertussis (Td, Tdap) vaccine. Pregnant women should  receive 1 dose of Tdap vaccine during each pregnancy. The dose should be obtained regardless of the length of time since the last dose. Immunization is preferred during the 27th-36th week of gestation. An adult who has not previously received Tdap or who does not know her vaccine status should receive 1 dose of Tdap. This initial dose should be followed by tetanus and diphtheria toxoids (Td) booster doses every 10 years. Adults with an unknown or incomplete history of completing a 3-dose immunization series with Td-containing vaccines should begin or complete a primary immunization series including a Tdap dose. Adults should receive a Td booster every 10 years.  Varicella vaccine. An adult without evidence of immunity to varicella should receive 2 doses or a second dose if she has previously received 1 dose. Pregnant females who do not have evidence of immunity should receive the first dose after pregnancy. This first dose should be obtained before leaving the health care facility. The second dose should be obtained 4-8 weeks after the first dose.  Human papillomavirus (HPV) vaccine. Females aged 13-26 years who have not received the vaccine previously should obtain the 3-dose series. The vaccine is not recommended for use in pregnant females. However, pregnancy testing is not needed before receiving a dose. If a female is found to be pregnant after receiving a dose, no treatment is needed. In that case, the remaining doses should be delayed until after the pregnancy. Immunization is recommended for any person with an immunocompromised condition through the age of 26 years if she did not get any or all doses earlier. During the 3-dose series, the second dose should be obtained 4-8 weeks after the first dose. The third dose should be obtained 24 weeks after the first dose and 16 weeks after the second dose.  Zoster vaccine. One dose is recommended for adults aged 60 years or older unless certain conditions are  present.  Measles, mumps, and rubella (MMR) vaccine. Adults born before 1957 generally are considered immune to measles and mumps. Adults born in 1957 or later should have 1 or more doses of MMR vaccine unless there is a contraindication to the vaccine or there is laboratory evidence of immunity to   each of the three diseases. A routine second dose of MMR vaccine should be obtained at least 28 days after the first dose for students attending postsecondary schools, health care workers, or international travelers. People who received inactivated measles vaccine or an unknown type of measles vaccine during 1963-1967 should receive 2 doses of MMR vaccine. People who received inactivated mumps vaccine or an unknown type of mumps vaccine before 1979 and are at high risk for mumps infection should consider immunization with 2 doses of MMR vaccine. For females of childbearing age, rubella immunity should be determined. If there is no evidence of immunity, females who are not pregnant should be vaccinated. If there is no evidence of immunity, females who are pregnant should delay immunization until after pregnancy. Unvaccinated health care workers born before 1957 who lack laboratory evidence of measles, mumps, or rubella immunity or laboratory confirmation of disease should consider measles and mumps immunization with 2 doses of MMR vaccine or rubella immunization with 1 dose of MMR vaccine.  Pneumococcal 13-valent conjugate (PCV13) vaccine. When indicated, a person who is uncertain of her immunization history and has no record of immunization should receive the PCV13 vaccine. An adult aged 19 years or older who has certain medical conditions and has not been previously immunized should receive 1 dose of PCV13 vaccine. This PCV13 should be followed with a dose of pneumococcal polysaccharide (PPSV23) vaccine. The PPSV23 vaccine dose should be obtained at least 8 weeks after the dose of PCV13 vaccine. An adult aged 19  years or older who has certain medical conditions and previously received 1 or more doses of PPSV23 vaccine should receive 1 dose of PCV13. The PCV13 vaccine dose should be obtained 1 or more years after the last PPSV23 vaccine dose.  Pneumococcal polysaccharide (PPSV23) vaccine. When PCV13 is also indicated, PCV13 should be obtained first. All adults aged 65 years and older should be immunized. An adult younger than age 65 years who has certain medical conditions should be immunized. Any person who resides in a nursing home or long-term care facility should be immunized. An adult smoker should be immunized. People with an immunocompromised condition and certain other conditions should receive both PCV13 and PPSV23 vaccines. People with human immunodeficiency virus (HIV) infection should be immunized as soon as possible after diagnosis. Immunization during chemotherapy or radiation therapy should be avoided. Routine use of PPSV23 vaccine is not recommended for American Indians, [place] Natives, or people younger than 65 years unless there are medical conditions that require PPSV23 vaccine. When indicated, people who have unknown immunization and have no record of immunization should receive PPSV23 vaccine. One-time revaccination 5 years after the first dose of PPSV23 is recommended for people aged 19-64 years who have chronic kidney failure, nephrotic syndrome, asplenia, or immunocompromised conditions. People who received 1-2 doses of PPSV23 before age 65 years should receive another dose of PPSV23 vaccine at age 65 years or later if at least 5 years have passed since the previous dose. Doses of PPSV23 are not needed for people immunized with PPSV23 at or after age 65 years.  Meningococcal vaccine. Adults with asplenia or persistent complement component deficiencies should receive 2 doses of quadrivalent meningococcal conjugate (MenACWY-D) vaccine. The doses should be obtained at least 2 months apart.  Microbiologists working with certain meningococcal bacteria, military recruits, people at risk during an outbreak, and people who travel to or live in countries with a high rate of meningitis should be immunized. A first-year college student up through age   21 years who is living in a residence hall should receive a dose if she did not receive a dose on or after her 16th birthday. Adults who have certain high-risk conditions should receive one or more doses of vaccine.  Hepatitis A vaccine. Adults who wish to be protected from this disease, have certain high-risk conditions, work with hepatitis A-infected animals, work in hepatitis A research labs, or travel to or work in countries with a high rate of hepatitis A should be immunized. Adults who were previously unvaccinated and who anticipate close contact with an international adoptee during the first 60 days after arrival in the [place] from a country with a high rate of hepatitis A should be immunized.  Hepatitis B vaccine. Adults who wish to be protected from this disease, have certain high-risk conditions, may be exposed to blood or other infectious body fluids, are household contacts or sex partners of hepatitis B positive people, are clients or workers in certain care facilities, or travel to or work in countries with a high rate of hepatitis B should be immunized.  Haemophilus influenzae type b (Hib) vaccine. A previously unvaccinated person with asplenia or sickle cell disease or having a scheduled splenectomy should receive 1 dose of Hib vaccine. Regardless of previous immunization, a recipient of a hematopoietic stem cell transplant should receive a 3-dose series 6-12 months after her successful transplant. Hib vaccine is not recommended for adults with HIV infection. Preventive Services / Frequency Ages 64 to 68 years  Blood pressure check.** / Every 1 to 2 years.  Lipid and cholesterol check.** / Every 5 years beginning at age  22.  Clinical breast exam.** / Every 3 years for women in their 88s and 53s.  BRCA-related cancer risk assessment.** / For women who have family members with a BRCA-related cancer (breast, ovarian, tubal, or peritoneal cancers).  Pap test.** / Every 2 years from ages 90 through 51. Every 3 years starting at age 21 through age 56 or 3 with a history of 3 consecutive normal Pap tests.  HPV screening.** / Every 3 years from ages 24 through ages 1 to 46 with a history of 3 consecutive normal Pap tests.  Hepatitis C blood test.** / For any individual with known risks for hepatitis C.  Skin self-exam. / Monthly.  Influenza vaccine. / Every year.  Tetanus, diphtheria, and acellular pertussis (Tdap, Td) vaccine.** / Consult your health care provider. Pregnant women should receive 1 dose of Tdap vaccine during each pregnancy. 1 dose of Td every 10 years.  Varicella vaccine.** / Consult your health care provider. Pregnant females who do not have evidence of immunity should receive the first dose after pregnancy.  HPV vaccine. / 3 doses over 6 months, if 72 and younger. The vaccine is not recommended for use in pregnant females. However, pregnancy testing is not needed before receiving a dose.  Measles, mumps, rubella (MMR) vaccine.** / You need at least 1 dose of MMR if you were born in 1957 or later. You may also need a 2nd dose. For females of childbearing age, rubella immunity should be determined. If there is no evidence of immunity, females who are not pregnant should be vaccinated. If there is no evidence of immunity, females who are pregnant should delay immunization until after pregnancy.  Pneumococcal 13-valent conjugate (PCV13) vaccine.** / Consult your health care provider.  Pneumococcal polysaccharide (PPSV23) vaccine.** / 1 to 2 doses if you smoke cigarettes or if you have certain conditions.  Meningococcal vaccine.** /  1 dose if you are age 19 to 21 years and a first-year college  student living in a residence hall, or have one of several medical conditions, you need to get vaccinated against meningococcal disease. You may also need additional booster doses.  Hepatitis A vaccine.** / Consult your health care provider.  Hepatitis B vaccine.** / Consult your health care provider.  Haemophilus influenzae type b (Hib) vaccine.** / Consult your health care provider. Ages 40 to 64 years  Blood pressure check.** / Every 1 to 2 years.  Lipid and cholesterol check.** / Every 5 years beginning at age 20 years.  Lung cancer screening. / Every year if you are aged 55-80 years and have a 30-pack-year history of smoking and currently smoke or have quit within the past 15 years. Yearly screening is stopped once you have quit smoking for at least 15 years or develop a health problem that would prevent you from having lung cancer treatment.  Clinical breast exam.** / Every year after age 40 years.  BRCA-related cancer risk assessment.** / For women who have family members with a BRCA-related cancer (breast, ovarian, tubal, or peritoneal cancers).  Mammogram.** / Every year beginning at age 40 years and continuing for as long as you are in good health. Consult with your health care provider.  Pap test.** / Every 3 years starting at age 30 years through age 65 or 70 years with a history of 3 consecutive normal Pap tests.  HPV screening.** / Every 3 years from ages 30 years through ages 65 to 70 years with a history of 3 consecutive normal Pap tests.  Fecal occult blood test (FOBT) of stool. / Every year beginning at age 50 years and continuing until age 75 years. You may not need to do this test if you get a colonoscopy every 10 years.  Flexible sigmoidoscopy or colonoscopy.** / Every 5 years for a flexible sigmoidoscopy or every 10 years for a colonoscopy beginning at age 50 years and continuing until age 75 years.  Hepatitis C blood test.** / For all people born from 1945 through  1965 and any individual with known risks for hepatitis C.  Skin self-exam. / Monthly.  Influenza vaccine. / Every year.  Tetanus, diphtheria, and acellular pertussis (Tdap/Td) vaccine.** / Consult your health care provider. Pregnant women should receive 1 dose of Tdap vaccine during each pregnancy. 1 dose of Td every 10 years.  Varicella vaccine.** / Consult your health care provider. Pregnant females who do not have evidence of immunity should receive the first dose after pregnancy.  Zoster vaccine.** / 1 dose for adults aged 60 years or older.  Measles, mumps, rubella (MMR) vaccine.** / You need at least 1 dose of MMR if you were born in 1957 or later. You may also need a 2nd dose. For females of childbearing age, rubella immunity should be determined. If there is no evidence of immunity, females who are not pregnant should be vaccinated. If there is no evidence of immunity, females who are pregnant should delay immunization until after pregnancy.  Pneumococcal 13-valent conjugate (PCV13) vaccine.** / Consult your health care provider.  Pneumococcal polysaccharide (PPSV23) vaccine.** / 1 to 2 doses if you smoke cigarettes or if you have certain conditions.  Meningococcal vaccine.** / Consult your health care provider.  Hepatitis A vaccine.** / Consult your health care provider.  Hepatitis B vaccine.** / Consult your health care provider.  Haemophilus influenzae type b (Hib) vaccine.** / Consult your health care provider. Ages 65   years and over  Blood pressure check.** / Every 1 to 2 years.  Lipid and cholesterol check.** / Every 5 years beginning at age 22 years.  Lung cancer screening. / Every year if you are aged 73-80 years and have a 30-pack-year history of smoking and currently smoke or have quit within the past 15 years. Yearly screening is stopped once you have quit smoking for at least 15 years or develop a health problem that would prevent you from having lung cancer  treatment.  Clinical breast exam.** / Every year after age 4 years.  BRCA-related cancer risk assessment.** / For women who have family members with a BRCA-related cancer (breast, ovarian, tubal, or peritoneal cancers).  Mammogram.** / Every year beginning at age 40 years and continuing for as long as you are in good health. Consult with your health care provider.  Pap test.** / Every 3 years starting at age 9 years through age 34 or 91 years with 3 consecutive normal Pap tests. Testing can be stopped between 65 and 70 years with 3 consecutive normal Pap tests and no abnormal Pap or HPV tests in the past 10 years.  HPV screening.** / Every 3 years from ages 57 years through ages 64 or 45 years with a history of 3 consecutive normal Pap tests. Testing can be stopped between 65 and 70 years with 3 consecutive normal Pap tests and no abnormal Pap or HPV tests in the past 10 years.  Fecal occult blood test (FOBT) of stool. / Every year beginning at age 15 years and continuing until age 17 years. You may not need to do this test if you get a colonoscopy every 10 years.  Flexible sigmoidoscopy or colonoscopy.** / Every 5 years for a flexible sigmoidoscopy or every 10 years for a colonoscopy beginning at age 86 years and continuing until age 71 years.  Hepatitis C blood test.** / For all people born from [birth year] through 1965 and any individual with known risks for hepatitis C.  Osteoporosis screening.** / A one-time screening for women ages 83 years and over and women at risk for fractures or osteoporosis.  Skin self-exam. / Monthly.  Influenza vaccine. / Every year.  Tetanus, diphtheria, and acellular pertussis (Tdap/Td) vaccine.** / 1 dose of Td every 10 years.  Varicella vaccine.** / Consult your health care provider.  Zoster vaccine.** / 1 dose for adults aged 61 years or older.  Pneumococcal 13-valent conjugate (PCV13) vaccine.** / Consult your health care provider.  Pneumococcal  polysaccharide (PPSV23) vaccine.** / 1 dose for all adults aged 28 years and older.  Meningococcal vaccine.** / Consult your health care provider.  Hepatitis A vaccine.** / Consult your health care provider.  Hepatitis B vaccine.** / Consult your health care provider.  Haemophilus influenzae type b (Hib) vaccine.** / Consult your health care provider. ** Family history and personal history of risk and conditions may change your health care provider's recommendations. Document Released: 08/21/2001 Document Revised: 11/09/2013 Document Reviewed: 11/20/2010 [facility] Patient Information 2015 [place], [place]. This information is not intended to replace advice given to you by your health care provider. Make sure you discuss any questions you have with your health care provider.

## 2014-10-14 NOTE — Progress Notes (Signed)
Subjective:     [name] is a 54 y.o. female and is here for a comprehensive physical exam. The patient reports no problems.  History   Social History  . Marital Status: Married    Spouse Name: N/A  . Number of Children: 1  . Years of Education: N/A   Occupational History  . 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 Smoking status: Never Smoker   . Smokeless tobacco: Never Used  . Alcohol Use: No  . Drug Use: No  . Sexual Activity:    Partners: Male   Other Topics Concern  . Not on file   Social History Narrative   Exercise--walk, daily   Health Maintenance  Topic Date Due  . INFLUENZA VACCINE  05/04/2015 (Originally 02/07/2015)  . HIV Screening  10/14/2015 (Originally 11/28/1975)  . MAMMOGRAM  05/03/2016  . PAP SMEAR  10/12/2016  . TETANUS/TDAP  03/20/2017  . COLONOSCOPY  09/26/2022    The following portions of the patient's history were reviewed and updated as appropriate:  She  has a past medical history of Gestational HTN; Melanoma; Asthma; Heart murmur; Hyperlipidemia; Allergy; Anemia; Anxiety; GERD (gastroesophageal reflux disease); and Diverticulosis. She  does not have any pertinent problems on file. She  has past surgical history that includes moles removed; nasal polyps; Cholecystectomy; and Colonoscopy. Her family history includes Cancer in her maternal uncle and maternal uncle; Diabetes in her maternal grandfather; Heart disease (age of onset: 25) in her maternal grandfather; Hyperlipidemia in her maternal grandfather, maternal grandmother, and mother; Hypertension in her mother; Stroke in her maternal grandmother; Stroke (age of onset: 36) in her mother; Thyroid disease in her mother. There is no history of Colon cancer, Rectal cancer, Stomach cancer, or Esophageal cancer. She  reports that she has never smoked. She has never used smokeless tobacco. She reports that she does not drink alcohol or use illicit drugs. She  has a current medication list which includes the following prescription(s): ibuprofen, ibuprofen, loratadine, lorazepam, omeprazole, and sumatriptan. Current Outpatient Prescriptions on File Prior to Visit  Medication Sig Dispense Refill  . ibuprofen (ADVIL,MOTRIN) 200 MG tablet Take 200 mg by mouth every 6 (six) hours as needed for pain.    . Ibuprofen (MIDOL PO) Take 1 capsule by mouth 4 (four) times daily as needed.    . loratadine (CLARITIN) 10 MG tablet Take 10 mg by mouth daily.     No current facility-administered medications on file prior to visit.   She has No Known Allergies..  Review of Systems Review of Systems  Constitutional: Negative for activity change, appetite change and fatigue.  HENT: Negative for hearing loss, congestion, tinnitus and ear discharge.  dentist q58m Eyes: Negative for visual disturbance (see optho q1y -- vision corrected to 20/20 with glasses).  Respiratory: Negative for cough, chest tightness and shortness of breath.   Cardiovascular: Negative for chest pain, palpitations and leg swelling.  Gastrointestinal: Negative for abdominal pain, diarrhea, constipation and abdominal distention.  Genitourinary: Negative for urgency, frequency, decreased urine volume and difficulty urinating.  Musculoskeletal: Negative for back pain, arthralgias and gait problem.  Skin: Negative for color change, pallor and rash.  Neurological: Negative for dizziness, light-headedness, numbness and headaches.  Hematological: Negative for adenopathy. Does not bruise/bleed easily.  Psychiatric/Behavioral: Negative for suicidal ideas, confusion, sleep disturbance, self-injury, dysphoric mood, decreased concentration and agitation.       Objective:    BP 120/72 mmHg  Pulse 65  Temp(Src) 97.7 F (  36.5 C) (Oral)  Ht 5\' 1"  (1.549 m)  Wt 130 lb 12.8 oz (59.33 kg)  BMI 24.73 kg/m2  SpO2 97% General appearance: alert, cooperative, appears stated age and no distress Head:  Normocephalic, without obvious abnormality, atraumatic Eyes: conjunctivae/corneas clear. PERRL, EOM's intact. Fundi benign. Ears: normal TM's and external ear canals both ears Nose: Nares normal. Septum midline. Mucosa normal. No drainage or sinus tenderness. Throat: lips, mucosa, and tongue normal; teeth and gums normal Neck: no adenopathy, no carotid bruit, no JVD, supple, symmetrical, trachea midline and thyroid not enlarged, symmetric, no tenderness/mass/nodules Back: symmetric, no curvature. ROM normal. No CVA tenderness. Lungs: clear to auscultation bilaterally Breasts: normal appearance, no masses or tenderness Heart: regular rate and rhythm, S1, S2 normal, no murmur, click, rub or gallop Abdomen: soft, non-tender; bowel sounds normal; no masses,  no organomegaly Pelvic: deferred Extremities: extremities normal, atraumatic, no cyanosis or edema Pulses: 2+ and symmetric Skin: Skin color, texture, turgor normal. No rashes or lesions Lymph nodes: Cervical, supraclavicular, and axillary nodes normal. Neurologic: Alert and oriented X 3, normal strength and tone. Normal symmetric reflexes. Normal coordination and gait Psych-- no anxiety, no depression      Assessment:    Healthy female exam.      Plan:    ghm utd Check labs See After Visit Summary for Counseling Recommendations   1. Preventative health care \ - Basic metabolic panel - CBC with Differential/Platelet - Hepatic function panel - Lipid panel - POCT urinalysis dipstick - TSH  2. Other type of migraine   - SUMAtriptan (IMITREX) 100 MG tablet; USE AS DIRECTED  Dispense: 12 tablet; Refill: 1  3. Anxiety   - LORazepam (ATIVAN) 1 MG tablet; Take 1 tablet (1 mg total) by mouth daily as needed.  Dispense: 90 tablet; Refill: 0  4. Gastroesophageal reflux disease, esophagitis presence not specified   - omeprazole (PRILOSEC) 40 MG capsule; Take 1 capsule (40 mg total) by mouth daily.  Dispense: 90 capsule; Refill:  3

## 2014-10-15 LAB — HEPATIC FUNCTION PANEL
ALBUMIN: 4.2 g/dL (ref 3.5–5.2)
ALT: 17 U/L (ref 0–35)
AST: 18 U/L (ref 0–37)
Alkaline Phosphatase: 69 U/L (ref 39–117)
Bilirubin, Direct: 0 mg/dL (ref 0.0–0.3)
Total Bilirubin: 0.3 mg/dL (ref 0.2–1.2)
Total Protein: 7.3 g/dL (ref 6.0–8.3)

## 2014-10-15 LAB — LIPID PANEL
Cholesterol: 209 mg/dL — ABNORMAL HIGH (ref 0–200)
HDL: 54.9 mg/dL (ref >39.00)
LDL Cholesterol: 143 mg/dL — ABNORMAL HIGH (ref 0–99)
NONHDL: 154.1
TRIGLYCERIDES: 56 mg/dL (ref 0.0–149.0)
Total CHOL/HDL Ratio: 4
VLDL: 11.2 mg/dL (ref 0.0–40.0)

## 2014-10-15 LAB — CBC WITH DIFFERENTIAL/PLATELET
Basophils Absolute: 0 10*3/uL (ref 0.0–0.1)
Basophils Relative: 0.6 % (ref 0.0–3.0)
EOS ABS: 0.5 10*3/uL (ref 0.0–0.7)
Eosinophils Relative: 7 % — ABNORMAL HIGH (ref 0.0–5.0)
HCT: 36 % (ref 36.0–46.0)
Hemoglobin: 12.3 g/dL (ref 12.0–15.0)
LYMPHS PCT: 33.8 % (ref 12.0–46.0)
Lymphs Abs: 2.2 10*3/uL (ref 0.7–4.0)
MCHC: 34 g/dL (ref 30.0–36.0)
MCV: 91.8 fl (ref 78.0–100.0)
MONOS PCT: 3.3 % (ref 3.0–12.0)
Monocytes Absolute: 0.2 10*3/uL (ref 0.1–1.0)
NEUTROS ABS: 3.7 10*3/uL (ref 1.4–7.7)
NEUTROS PCT: 55.3 % (ref 43.0–77.0)
Platelets: 309 10*3/uL (ref 150.0–400.0)
RBC: 3.92 Mil/uL (ref 3.87–5.11)
RDW: 13.5 % (ref 11.5–15.5)
WBC: 6.7 10*3/uL (ref 4.0–10.5)

## 2014-10-15 LAB — BASIC METABOLIC PANEL
BUN: 24 mg/dL — ABNORMAL HIGH (ref 6–23)
CO2: 27 meq/L (ref 19–32)
Calcium: 9.4 mg/dL (ref 8.4–10.5)
Chloride: 103 mEq/L (ref 96–112)
Creatinine, Ser: 0.77 mg/dL (ref 0.40–1.20)
GFR: 83.07 mL/min (ref >60.00)
Glucose, Bld: 83 mg/dL (ref 70–99)
Potassium: 4 mEq/L (ref 3.5–5.1)
Sodium: 136 mEq/L (ref 135–145)

## 2014-10-15 LAB — TSH: TSH: 1.37 u[IU]/mL (ref 0.35–4.50)

## 2014-10-16 LAB — URINE CULTURE: Colony Count: 9000

## 2014-10-22 ENCOUNTER — Encounter (HOSPITAL_COMMUNITY): Payer: Self-pay | Admitting: *Deleted

## 2014-10-27 NOTE — H&P (Signed)
History of Present Illness   I was consulted by Dr Etter Sjogren regarding Ms Weissmann's intermittent left flank pain over the last 3 weeks. She has noticed a little blood in her urine. When she had a gallbladder x-rayed a while ago, they told her she had a stone or stones in her kidney.   At baseline, she leaks with coughing and sneezing and wears 1 pad a day. She has rare urge incontinence if she holds it too long. She can void every 30-60 minutes, but she can hold it and go every 2 hours at school. She is starting to get up once a night. She reports a good flow but can double void a smaller or larger amount.    When I saw Ms Kassem recently, she had urethral dilation as a child. She had been having left-sided flank pain. She had a CT hematuria protocol. I was going to perform cystoscopy if the upper tracts were normal. She does take daily aspirin. Urine culture was negative.   Review of Systems: No change in bowel or neurologic systems.    Past Medical History Problems  1. History of Anxiety (F41.9) 2. History of cardiac murmur (Z86.79) 3. History of esophageal reflux (Z87.19) 4. History of hypercholesterolemia (Z86.39) 5. History of hypertension (Z86.79) 6. History of malignant melanoma (Z85.820) 7. History of urinary frequency (X03.833)  Surgical History Problems  1. History of Cholecystectomy  Current Meds 1. Advil TABS;  Therapy: (Recorded:15Feb2016) to Recorded 2. Imitrex TABS;  Therapy: (Recorded:15Feb2016) to Recorded 3. LORazepam TABS;  Therapy: (Recorded:15Feb2016) to Recorded 4. Magnesium CAPS;  Therapy: (Recorded:15Feb2016) to Recorded 5. Omeprazole TBEC;  Therapy: (Recorded:15Feb2016) to Recorded 6. ValACYclovir HCl TABS;  Therapy: (Recorded:15Feb2016) to Recorded 7. Vitamin E TABS;  Therapy: (Recorded:15Feb2016) to Recorded  Allergies Medication  1. No Known Drug Allergies  Family History Problems  1. Family history of Blood in urine due to kidney disorder :  Mother, Sibling 2. Family history of kidney stones (Z84.1) : Brother 3. Family history of myocardial infarction (Z82.49) : Maternal Grandfather 4. Family history of stroke (Z82.3) : Maternal Grandfather  Social History Problems  1. Denied: History of Alcohol use 2. Caffeine use (F15.90)   2 cups of tea daily 3. Never smoker 4. Occupation   PreK assistant  Results/Data  Urine [Data Includes: Last 1 Day]   [id number]  COLOR YELLOW   APPEARANCE CLOUDY   SPECIFIC GRAVITY 1.025   pH 6.0   GLUCOSE NEG mg/dL  BILIRUBIN NEG   KETONE NEG mg/dL  BLOOD LARGE   PROTEIN 30 mg/dL  UROBILINOGEN 0.2 mg/dL  NITRITE NEG   LEUKOCYTE ESTERASE TRACE   SQUAMOUS EPITHELIAL/HPF FEW   WBC NONE SEEN WBC/hpf  RBC 21-50 RBC/hpf  BACTERIA NONE SEEN   CRYSTALS NONE SEEN   CASTS NONE SEEN    Assessment Assessed  1. Microscopic hematuria (R31.2) 2. Increased urinary frequency (R35.0) 3. Nephrolithiasis (N20.0)  Plan Microscopic hematuria  1. Follow-up Schedule Surgery Office  Follow-up  Status: Complete  Done: [id number] Nephrolithiasis  2. Start: Hydrocodone-Acetaminophen 5-325 MG Oral Tablet; TAKE 1 TABLET EVERY 4 TO  6 HOURS AS NEEDED FOR PAIN 3. Start: Promethazine HCl - 25 MG Oral Tablet; TAKE 1 TABLET Every  6 hours PRN  Discussion/Summary I reviewed Ms Dickens's CT scan and she has an impressive sized stone on the left in the renal pelvis. She has some cysts in the right kidney and possibly a peripelvic cyst. I do not think she has a distal left ureteral stone.  She did have blood in her urine again today, but no bacteria.    Ms Lesniak's left flank pain and blood in the urine was due to a stone. Cystoscopy was not performed. I talked about watchful waiting versus lithotripsy.  We talked about ESWL in detail. Pros, cons, general surgical and anesthetic risks, and other options including watchful waiting and ureteroscopy were discussed. Success and failure rates and need for  further/repeat therapy were discussed. Risks were described but not limited to pain, infection, sepsis, and bleeding. The risk of renal and ureteral trauma with short and long term sequelae was discussed. The risk of injury to adjacent structures was discussed. The risk of needing a stent post-ESWL was discussed.  I think lithotripsy is prudent to do on a nonurgent basis. Indication to go to the [facility] Emergency Room given. Just in case, I gave her 40 Vicodin and 20 Phenergan. We will proceed accordingly.   Ms Deyoung's stone is approximately 9 mm in size. It is 1200 Hounsfield Units and I spoke to Dr Junious Silk and she understands the failure is a little bit higher because of the hardness of the stone.  After a thorough review of the management options for the patient's condition the patient  elected to proceed with surgical therapy as noted above. We have discussed the potential benefits and risks of the procedure, side effects of the proposed treatment, the likelihood of the patient achieving the goals of the procedure, and any potential problems that might occur during the procedure or recuperation. Informed consent has been obtained.

## 2014-10-28 ENCOUNTER — Ambulatory Visit (HOSPITAL_COMMUNITY)
Admission: RE | Admit: 2014-10-28 | Discharge: 2014-10-28 | Disposition: A | Discharge status: Home / Self Care | Payer: BC Managed Care – PPO | Source: Ambulatory Visit | Attending: Urology | Admitting: Urology

## 2014-10-28 ENCOUNTER — Encounter (HOSPITAL_COMMUNITY)
Admission: RE | Disposition: A | Discharge status: Home / Self Care | Payer: Self-pay | Source: Ambulatory Visit | Attending: Urology

## 2014-10-28 ENCOUNTER — Encounter (HOSPITAL_COMMUNITY): Payer: Self-pay | Admitting: General Practice

## 2014-10-28 ENCOUNTER — Ambulatory Visit (HOSPITAL_COMMUNITY): Payer: BC Managed Care – PPO

## 2014-10-28 DIAGNOSIS — Z791 Long term (current) use of non-steroidal anti-inflammatories (NSAID): Secondary | ICD-10-CM | POA: Diagnosis not present

## 2014-10-28 DIAGNOSIS — F419 Anxiety disorder, unspecified: Secondary | ICD-10-CM | POA: Insufficient documentation

## 2014-10-28 DIAGNOSIS — E780 Pure hypercholesterolemia: Secondary | ICD-10-CM | POA: Insufficient documentation

## 2014-10-28 DIAGNOSIS — K219 Gastro-esophageal reflux disease without esophagitis: Secondary | ICD-10-CM | POA: Insufficient documentation

## 2014-10-28 DIAGNOSIS — N200 Calculus of kidney: Secondary | ICD-10-CM | POA: Diagnosis not present

## 2014-10-28 DIAGNOSIS — Z79899 Other long term (current) drug therapy: Secondary | ICD-10-CM | POA: Insufficient documentation

## 2014-10-28 DIAGNOSIS — N201 Calculus of ureter: Secondary | ICD-10-CM

## 2014-10-28 DIAGNOSIS — R109 Unspecified abdominal pain: Secondary | ICD-10-CM | POA: Diagnosis present

## 2014-10-28 DIAGNOSIS — I10 Essential (primary) hypertension: Secondary | ICD-10-CM | POA: Insufficient documentation

## 2014-10-28 SURGERY — LITHOTRIPSY, ESWL
Anesthesia: LOCAL | Laterality: Left

## 2014-10-28 MED ORDER — DEXTROSE-NACL 5-0.45 % IV SOLN
INTRAVENOUS | Status: DC
  Administered 2014-10-28: 07:00:00 via INTRAVENOUS

## 2014-10-28 MED ORDER — CIPROFLOXACIN HCL 500 MG PO TABS
500.0000 mg | ORAL_TABLET | ORAL | Status: AC
  Administered 2014-10-28: 500 mg via ORAL
  Filled 2014-10-28: qty 1

## 2014-10-28 MED ORDER — DIPHENHYDRAMINE HCL 25 MG PO CAPS
25.0000 mg | ORAL_CAPSULE | ORAL | Status: AC
  Administered 2014-10-28: 25 mg via ORAL
  Filled 2014-10-28: qty 1

## 2014-10-28 MED ORDER — DIAZEPAM 5 MG PO TABS
10.0000 mg | ORAL_TABLET | ORAL | Status: AC
  Administered 2014-10-28: 10 mg via ORAL
  Filled 2014-10-28: qty 2

## 2014-10-28 NOTE — Discharge Instructions (Signed)
I have reviewed discharge instructions in detail with the patient. They will follow-up with me or their physician as scheduled. My nurse will also be calling the patients as per protocol.   

## 2014-10-28 NOTE — Interval H&P Note (Signed)
History and Physical Interval Note:  4/97/5300 5:11 AM  [name]  has presented today for surgery, with the diagnosis of LEFT URETERAL STONE  The various methods of treatment have been discussed with the patient and family. After consideration of risks, benefits and other options for treatment, the patient has consented to  Procedure(s): LEFT EXTRACORPOREAL SHOCK WAVE LITHOTRIPSY (ESWL) (Left) as a surgical intervention .  The patient's history has been reviewed, patient examined, no change in status, stable for surgery.  I have reviewed the patient's chart and labs.  Questions were answered to the patient's satisfaction.     Jenaro Souder A

## 2015-07-22 ENCOUNTER — Telehealth: Payer: Self-pay | Admitting: Family Medicine

## 2015-07-22 DIAGNOSIS — G43809 Other migraine, not intractable, without status migrainosus: Secondary | ICD-10-CM

## 2015-07-22 DIAGNOSIS — F419 Anxiety disorder, unspecified: Secondary | ICD-10-CM

## 2015-07-22 MED ORDER — LORAZEPAM 1 MG PO TABS: 1.0000 mg | ORAL_TABLET | Freq: Every day | ORAL | Status: DC | PRN

## 2015-07-22 MED ORDER — SUMATRIPTAN SUCCINATE 100 MG PO TABS: ORAL_TABLET | ORAL | Status: DC

## 2015-07-22 NOTE — Telephone Encounter (Signed)
Refill x1 

## 2015-07-22 NOTE — Telephone Encounter (Signed)
Rx faxed to [facility] as requested.  [place]

## 2015-07-22 NOTE — Telephone Encounter (Signed)
Caller name: [place]   Can be reached: [phone]   Fax #  [phone]    Reason for call: Request refill on SUMAtriptan (IMITREX) 100 MG tablet [id number] and LORazepam (ATIVAN) 1 MG tablet [id number]

## 2015-07-22 NOTE — Telephone Encounter (Signed)
Last seen and filled 10/14/14 #90.   Please advise     KP

## 2015-10-13 ENCOUNTER — Telehealth: Payer: Self-pay | Admitting: *Deleted

## 2015-10-13 NOTE — Telephone Encounter (Signed)
Unable to reach patient at time of pre-visit call. Left message for patient to return call when available.  

## 2015-10-17 ENCOUNTER — Encounter: Payer: BC Managed Care – PPO | Admitting: Family Medicine

## 2015-10-17 ENCOUNTER — Ambulatory Visit (INDEPENDENT_AMBULATORY_CARE_PROVIDER_SITE_OTHER): Payer: BC Managed Care – PPO | Admitting: Family Medicine

## 2015-10-17 ENCOUNTER — Encounter: Payer: Self-pay | Admitting: Family Medicine

## 2015-10-17 VITALS — BP 110/72 | HR 73 | Temp 97.6°F | Ht 61.0 in | Wt 133.6 lb

## 2015-10-17 DIAGNOSIS — Z1159 Encounter for screening for other viral diseases: Secondary | ICD-10-CM | POA: Diagnosis not present

## 2015-10-17 DIAGNOSIS — Z0000 Encounter for general adult medical examination without abnormal findings: Secondary | ICD-10-CM | POA: Diagnosis not present

## 2015-10-17 DIAGNOSIS — H9193 Unspecified hearing loss, bilateral: Secondary | ICD-10-CM

## 2015-10-17 DIAGNOSIS — G43809 Other migraine, not intractable, without status migrainosus: Secondary | ICD-10-CM

## 2015-10-17 DIAGNOSIS — K219 Gastro-esophageal reflux disease without esophagitis: Secondary | ICD-10-CM

## 2015-10-17 DIAGNOSIS — Z114 Encounter for screening for human immunodeficiency virus [HIV]: Secondary | ICD-10-CM

## 2015-10-17 LAB — CBC WITH DIFFERENTIAL/PLATELET
BASOS ABS: 0 10*3/uL (ref 0.0–0.1)
Basophils Relative: 0.6 % (ref 0.0–3.0)
Eosinophils Absolute: 0.4 10*3/uL (ref 0.0–0.7)
Eosinophils Relative: 7.6 % — ABNORMAL HIGH (ref 0.0–5.0)
HCT: 40.3 % (ref 36.0–46.0)
HEMOGLOBIN: 13.6 g/dL (ref 12.0–15.0)
LYMPHS ABS: 2.1 10*3/uL (ref 0.7–4.0)
Lymphocytes Relative: 39.4 % (ref 12.0–46.0)
MCHC: 33.8 g/dL (ref 30.0–36.0)
MCV: 91.3 fl (ref 78.0–100.0)
Monocytes Absolute: 0.4 10*3/uL (ref 0.1–1.0)
Monocytes Relative: 6.7 % (ref 3.0–12.0)
NEUTROS PCT: 45.7 % (ref 43.0–77.0)
Neutro Abs: 2.5 10*3/uL (ref 1.4–7.7)
Platelets: 300 10*3/uL (ref 150.0–400.0)
RBC: 4.41 Mil/uL (ref 3.87–5.11)
RDW: 13.9 % (ref 11.5–15.5)
WBC: 5.4 10*3/uL (ref 4.0–10.5)

## 2015-10-17 LAB — COMPREHENSIVE METABOLIC PANEL
ALK PHOS: 74 U/L (ref 39–117)
ALT: 20 U/L (ref 0–35)
AST: 20 U/L (ref 0–37)
Albumin: 4.4 g/dL (ref 3.5–5.2)
BILIRUBIN TOTAL: 0.4 mg/dL (ref 0.2–1.2)
BUN: 19 mg/dL (ref 6–23)
CALCIUM: 10.1 mg/dL (ref 8.4–10.5)
CHLORIDE: 105 meq/L (ref 96–112)
CO2: 28 mEq/L (ref 19–32)
CREATININE: 0.83 mg/dL (ref 0.40–1.20)
GFR: 75.89 mL/min (ref >60.00)
Glucose, Bld: 86 mg/dL (ref 70–99)
Potassium: 3.8 mEq/L (ref 3.5–5.1)
Sodium: 141 mEq/L (ref 135–145)
TOTAL PROTEIN: 7.6 g/dL (ref 6.0–8.3)

## 2015-10-17 LAB — POCT URINALYSIS DIPSTICK
Bilirubin, UA: NEGATIVE
Glucose, UA: NEGATIVE
Ketones, UA: NEGATIVE
LEUKOCYTES UA: NEGATIVE
Nitrite, UA: NEGATIVE
PH UA: 6.5
Protein, UA: NEGATIVE
RBC UA: NEGATIVE
Spec Grav, UA: 1.01
Urobilinogen, UA: 0.2

## 2015-10-17 LAB — LIPID PANEL
CHOL/HDL RATIO: 4
Cholesterol: 224 mg/dL — ABNORMAL HIGH (ref 0–200)
HDL: 61.7 mg/dL (ref >39.00)
LDL Cholesterol: 152 mg/dL — ABNORMAL HIGH (ref 0–99)
NonHDL: 162.53
Triglycerides: 54 mg/dL (ref 0.0–149.0)
VLDL: 10.8 mg/dL (ref 0.0–40.0)

## 2015-10-17 LAB — HIV ANTIBODY (ROUTINE TESTING W REFLEX): HIV: NONREACTIVE

## 2015-10-17 LAB — HEPATITIS C ANTIBODY: HCV Ab: NEGATIVE

## 2015-10-17 LAB — TSH: TSH: 1.82 u[IU]/mL (ref 0.35–4.50)

## 2015-10-17 MED ORDER — OMEPRAZOLE 40 MG PO CPDR: 40.0000 mg | DELAYED_RELEASE_CAPSULE | Freq: Every day | ORAL | Status: DC

## 2015-10-17 MED ORDER — SUMATRIPTAN SUCCINATE 100 MG PO TABS: ORAL_TABLET | ORAL | Status: DC

## 2015-10-17 NOTE — Patient Instructions (Addendum)
Preventive Care for Adults, Female A healthy lifestyle and preventive care can promote health and wellness. Preventive health guidelines for women include the following key practices.  A routine yearly physical is a good way to check with your health care provider about your health and preventive screening. It is a chance to share any concerns and updates on your health and to receive a thorough exam.  Visit your dentist for a routine exam and preventive care every 6 months. Brush your teeth twice a day and floss once a day. Good oral hygiene prevents tooth decay and gum disease.  The frequency of eye exams is based on your age, health, family medical history, use of contact lenses, and other factors. Follow your health care provider's recommendations for frequency of eye exams.  Eat a healthy diet. Foods like vegetables, fruits, whole grains, low-fat dairy products, and lean protein foods contain the nutrients you need without too many calories. Decrease your intake of foods high in solid fats, added sugars, and salt. Eat the right amount of calories for you.Get information about a proper diet from your health care provider, if necessary.  Regular physical exercise is one of the most important things you can do for your health. Most adults should get at least 150 minutes of moderate-intensity exercise (any activity that increases your heart rate and causes you to sweat) each week. In addition, most adults need muscle-strengthening exercises on 2 or more days a week.  Maintain a healthy weight. The body mass index (BMI) is a screening tool to identify possible weight problems. It provides an estimate of body fat based on height and weight. Your health care provider can find your BMI and can help you achieve or maintain a healthy weight.For adults 20 years and older:  A BMI below 18.5 is considered underweight.  A BMI of 18.5 to 24.9 is normal.  A BMI of 25 to 29.9 is considered overweight.  A  BMI of 30 and above is considered obese.  Maintain normal blood lipids and cholesterol levels by exercising and minimizing your intake of saturated fat. Eat a balanced diet with plenty of fruit and vegetables. Blood tests for lipids and cholesterol should begin at age 45 and be repeated every 5 years. If your lipid or cholesterol levels are high, you are over 50, or you are at high risk for heart disease, you may need your cholesterol levels checked more frequently.Ongoing high lipid and cholesterol levels should be treated with medicines if diet and exercise are not working.  If you smoke, find out from your health care provider how to quit. If you do not use tobacco, do not start.  Lung cancer screening is recommended for adults aged 45-80 years who are at high risk for developing lung cancer because of a history of smoking. A yearly low-dose CT scan of the lungs is recommended for people who have at least a 30-pack-year history of smoking and are a current smoker or have quit within the past 15 years. A pack year of smoking is smoking an average of 1 pack of cigarettes a day for 1 year (for example: 1 pack a day for 30 years or 2 packs a day for 15 years). Yearly screening should continue until the smoker has stopped smoking for at least 15 years. Yearly screening should be stopped for people who develop a health problem that would prevent them from having lung cancer treatment.  If you are pregnant, do not drink alcohol. If you are  breastfeeding, be very cautious about drinking alcohol. If you are not pregnant and choose to drink alcohol, do not have more than 1 drink per day. One drink is considered to be 12 ounces (355 mL) of beer, 5 ounces (148 mL) of wine, or 1.5 ounces (44 mL) of liquor.  Avoid use of street drugs. Do not share needles with anyone. Ask for help if you need support or instructions about stopping the use of drugs.  High blood pressure causes heart disease and increases the risk  of stroke. Your blood pressure should be checked at least every 1 to 2 years. Ongoing high blood pressure should be treated with medicines if weight loss and exercise do not work.  If you are 55-79 years old, ask your health care provider if you should take aspirin to prevent strokes.  Diabetes screening is done by taking a blood sample to check your blood glucose level after you have not eaten for a certain period of time (fasting). If you are not overweight and you do not have risk factors for diabetes, you should be screened once every 3 years starting at age 45. If you are overweight or obese and you are 40-70 years of age, you should be screened for diabetes every year as part of your cardiovascular risk assessment.  Breast cancer screening is essential preventive care for women. You should practice "breast self-awareness." This means understanding the normal appearance and feel of your breasts and may include breast self-examination. Any changes detected, no matter how small, should be reported to a health care provider. Women in their 20s and 30s should have a clinical breast exam (CBE) by a health care provider as part of a regular health exam every 1 to 3 years. After age 40, women should have a CBE every year. Starting at age 40, women should consider having a mammogram (breast X-ray test) every year. Women who have a family history of breast cancer should talk to their health care provider about genetic screening. Women at a high risk of breast cancer should talk to their health care providers about having an MRI and a mammogram every year.  Breast cancer gene (BRCA)-related cancer risk assessment is recommended for women who have family members with BRCA-related cancers. BRCA-related cancers include breast, ovarian, tubal, and peritoneal cancers. Having family members with these cancers may be associated with an increased risk for harmful changes (mutations) in the breast cancer genes BRCA1 and  BRCA2. Results of the assessment will determine the need for genetic counseling and BRCA1 and BRCA2 testing.  Your health care provider may recommend that you be screened regularly for cancer of the pelvic organs (ovaries, uterus, and vagina). This screening involves a pelvic examination, including checking for microscopic changes to the surface of your cervix (Pap test). You may be encouraged to have this screening done every 3 years, beginning at age 21.  For women ages 30-65, health care providers may recommend pelvic exams and Pap testing every 3 years, or they may recommend the Pap and pelvic exam, combined with testing for human papilloma virus (HPV), every 5 years. Some types of HPV increase your risk of cervical cancer. Testing for HPV may also be done on women of any age with unclear Pap test results.  Other health care providers may not recommend any screening for nonpregnant women who are considered low risk for pelvic cancer and who do not have symptoms. Ask your health care provider if a screening pelvic exam is right for   you.  If you have had past treatment for cervical cancer or a condition that could lead to cancer, you need Pap tests and screening for cancer for at least 20 years after your treatment. If Pap tests have been discontinued, your risk factors (such as having a new sexual partner) need to be reassessed to determine if screening should resume. Some women have medical problems that increase the chance of getting cervical cancer. In these cases, your health care provider may recommend more frequent screening and Pap tests.  Colorectal cancer can be detected and often prevented. Most routine colorectal cancer screening begins at the age of 50 years and continues through age 75 years. However, your health care provider may recommend screening at an earlier age if you have risk factors for colon cancer. On a yearly basis, your health care provider may provide home test kits to check  for hidden blood in the stool. Use of a small camera at the end of a tube, to directly examine the colon (sigmoidoscopy or colonoscopy), can detect the earliest forms of colorectal cancer. Talk to your health care provider about this at age 50, when routine screening begins. Direct exam of the colon should be repeated every 5-10 years through age 75 years, unless early forms of precancerous polyps or small growths are found.  People who are at an increased risk for hepatitis B should be screened for this virus. You are considered at high risk for hepatitis B if:  You were born in a country where hepatitis B occurs often. Talk with your health care provider about which countries are considered high risk.  Your parents were born in a high-risk country and you have not received a shot to protect against hepatitis B (hepatitis B vaccine).  You have HIV or AIDS.  You use needles to inject street drugs.  You live with, or have sex with, someone who has hepatitis B.  You get hemodialysis treatment.  You take certain medicines for conditions like cancer, organ transplantation, and autoimmune conditions.  Hepatitis C blood testing is recommended for all people born from 1945 through 1965 and any individual with known risks for hepatitis C.  Practice safe sex. Use condoms and avoid high-risk sexual practices to reduce the spread of sexually transmitted infections (STIs). STIs include gonorrhea, chlamydia, syphilis, trichomonas, herpes, HPV, and human immunodeficiency virus (HIV). Herpes, HIV, and HPV are viral illnesses that have no cure. They can result in disability, cancer, and death.  You should be screened for sexually transmitted illnesses (STIs) including gonorrhea and chlamydia if:  You are sexually active and are younger than 24 years.  You are older than 24 years and your health care provider tells you that you are at risk for this type of infection.  Your sexual activity has changed  since you were last screened and you are at an increased risk for chlamydia or gonorrhea. Ask your health care provider if you are at risk.  If you are at risk of being infected with HIV, it is recommended that you take a prescription medicine daily to prevent HIV infection. This is called preexposure prophylaxis (PrEP). You are considered at risk if:  You are sexually active and do not regularly use condoms or know the HIV status of your partner(s).  You take drugs by injection.  You are sexually active with a partner who has HIV.  Talk with your health care provider about whether you are at high risk of being infected with HIV. If   you choose to begin PrEP, you should first be tested for HIV. You should then be tested every 3 months for as long as you are taking PrEP.  Osteoporosis is a disease in which the bones lose minerals and strength with aging. This can result in serious bone fractures or breaks. The risk of osteoporosis can be identified using a bone density scan. Women ages 67 years and over and women at risk for fractures or osteoporosis should discuss screening with their health care providers. Ask your health care provider whether you should take a calcium supplement or vitamin D to reduce the rate of osteoporosis.  Menopause can be associated with physical symptoms and risks. Hormone replacement therapy is available to decrease symptoms and risks. You should talk to your health care provider about whether hormone replacement therapy is right for you.  Use sunscreen. Apply sunscreen liberally and repeatedly throughout the day. You should seek shade when your shadow is shorter than you. Protect yourself by wearing long sleeves, pants, a wide-brimmed hat, and sunglasses year round, whenever you are outdoors.  Once a month, do a whole body skin exam, using a mirror to look at the skin on your back. Tell your health care provider of new moles, moles that have irregular borders, moles that  are larger than a pencil eraser, or moles that have changed in shape or color.  Stay current with required vaccines (immunizations).  Influenza vaccine. All adults should be immunized every year.  Tetanus, diphtheria, and acellular pertussis (Td, Tdap) vaccine. Pregnant women should receive 1 dose of Tdap vaccine during each pregnancy. The dose should be obtained regardless of the length of time since the last dose. Immunization is preferred during the 27th-36th week of gestation. An adult who has not previously received Tdap or who does not know her vaccine status should receive 1 dose of Tdap. This initial dose should be followed by tetanus and diphtheria toxoids (Td) booster doses every 10 years. Adults with an unknown or incomplete history of completing a 3-dose immunization series with Td-containing vaccines should begin or complete a primary immunization series including a Tdap dose. Adults should receive a Td booster every 10 years.  Varicella vaccine. An adult without evidence of immunity to varicella should receive 2 doses or a second dose if she has previously received 1 dose. Pregnant females who do not have evidence of immunity should receive the first dose after pregnancy. This first dose should be obtained before leaving the health care facility. The second dose should be obtained 4-8 weeks after the first dose.  Human papillomavirus (HPV) vaccine. Females aged 13-26 years who have not received the vaccine previously should obtain the 3-dose series. The vaccine is not recommended for use in pregnant females. However, pregnancy testing is not needed before receiving a dose. If a female is found to be pregnant after receiving a dose, no treatment is needed. In that case, the remaining doses should be delayed until after the pregnancy. Immunization is recommended for any person with an immunocompromised condition through the age of 61 years if she did not get any or all doses earlier. During the  3-dose series, the second dose should be obtained 4-8 weeks after the first dose. The third dose should be obtained 24 weeks after the first dose and 16 weeks after the second dose.  Zoster vaccine. One dose is recommended for adults aged 30 years or older unless certain conditions are present.  Measles, mumps, and rubella (MMR) vaccine. Adults born  before 1957 generally are considered immune to measles and mumps. Adults born in 1957 or later should have 1 or more doses of MMR vaccine unless there is a contraindication to the vaccine or there is laboratory evidence of immunity to each of the three diseases. A routine second dose of MMR vaccine should be obtained at least 28 days after the first dose for students attending postsecondary schools, health care workers, or international travelers. People who received inactivated measles vaccine or an unknown type of measles vaccine during 1963-1967 should receive 2 doses of MMR vaccine. People who received inactivated mumps vaccine or an unknown type of mumps vaccine before 1979 and are at high risk for mumps infection should consider immunization with 2 doses of MMR vaccine. For females of childbearing age, rubella immunity should be determined. If there is no evidence of immunity, females who are not pregnant should be vaccinated. If there is no evidence of immunity, females who are pregnant should delay immunization until after pregnancy. Unvaccinated health care workers born before 1957 who lack laboratory evidence of measles, mumps, or rubella immunity or laboratory confirmation of disease should consider measles and mumps immunization with 2 doses of MMR vaccine or rubella immunization with 1 dose of MMR vaccine.  Pneumococcal 13-valent conjugate (PCV13) vaccine. When indicated, a person who is uncertain of his immunization history and has no record of immunization should receive the PCV13 vaccine. All adults 65 years of age and older should receive this  vaccine. An adult aged 19 years or older who has certain medical conditions and has not been previously immunized should receive 1 dose of PCV13 vaccine. This PCV13 should be followed with a dose of pneumococcal polysaccharide (PPSV23) vaccine. Adults who are at high risk for pneumococcal disease should obtain the PPSV23 vaccine at least 8 weeks after the dose of PCV13 vaccine. Adults older than 55 years of age who have normal immune system function should obtain the PPSV23 vaccine dose at least 1 year after the dose of PCV13 vaccine.  Pneumococcal polysaccharide (PPSV23) vaccine. When PCV13 is also indicated, PCV13 should be obtained first. All adults aged 65 years and older should be immunized. An adult younger than age 65 years who has certain medical conditions should be immunized. Any person who resides in a nursing home or long-term care facility should be immunized. An adult smoker should be immunized. People with an immunocompromised condition and certain other conditions should receive both PCV13 and PPSV23 vaccines. People with human immunodeficiency virus (HIV) infection should be immunized as soon as possible after diagnosis. Immunization during chemotherapy or radiation therapy should be avoided. Routine use of PPSV23 vaccine is not recommended for American Indians, [place] Natives, or people younger than 65 years unless there are medical conditions that require PPSV23 vaccine. When indicated, people who have unknown immunization and have no record of immunization should receive PPSV23 vaccine. One-time revaccination 5 years after the first dose of PPSV23 is recommended for people aged 19-64 years who have chronic kidney failure, nephrotic syndrome, asplenia, or immunocompromised conditions. People who received 1-2 doses of PPSV23 before age 65 years should receive another dose of PPSV23 vaccine at age 65 years or later if at least 5 years have passed since the previous dose. Doses of PPSV23 are not  needed for people immunized with PPSV23 at or after age 65 years.  Meningococcal vaccine. Adults with asplenia or persistent complement component deficiencies should receive 2 doses of quadrivalent meningococcal conjugate (MenACWY-D) vaccine. The doses should be obtained   at least 2 months apart. Microbiologists working with certain meningococcal bacteria, [place] recruits, people at risk during an outbreak, and people who travel to or live in countries with a high rate of meningitis should be immunized. A first-year college student up through age 34 years who is living in a residence hall should receive a dose if she did not receive a dose on or after her 16th birthday. Adults who have certain high-risk conditions should receive one or more doses of vaccine.  Hepatitis A vaccine. Adults who wish to be protected from this disease, have certain high-risk conditions, work with hepatitis A-infected animals, work in hepatitis A research labs, or travel to or work in countries with a high rate of hepatitis A should be immunized. Adults who were previously unvaccinated and who anticipate close contact with an international adoptee during the first 60 days after arrival in the [place] from a country with a high rate of hepatitis A should be immunized.  Hepatitis B vaccine. Adults who wish to be protected from this disease, have certain high-risk conditions, may be exposed to blood or other infectious body fluids, are household contacts or sex partners of hepatitis B positive people, are clients or workers in certain care facilities, or travel to or work in countries with a high rate of hepatitis B should be immunized.  Haemophilus influenzae type b (Hib) vaccine. A previously unvaccinated person with asplenia or sickle cell disease or having a scheduled splenectomy should receive 1 dose of Hib vaccine. Regardless of previous immunization, a recipient of a hematopoietic stem cell transplant should receive a  3-dose series 6-12 months after her successful transplant. Hib vaccine is not recommended for adults with HIV infection. Preventive Services / Frequency Ages 35 to 4 years  Blood pressure check.** / Every 3-5 years.  Lipid and cholesterol check.** / Every 5 years beginning at age 60.  Clinical breast exam.** / Every 3 years for women in their 71s and 10s.  BRCA-related cancer risk assessment.** / For women who have family members with a BRCA-related cancer (breast, ovarian, tubal, or peritoneal cancers).  Pap test.** / Every 2 years from ages 76 through 26. Every 3 years starting at age 61 through age 76 or 93 with a history of 3 consecutive normal Pap tests.  HPV screening.** / Every 3 years from ages 37 through ages 60 to 51 with a history of 3 consecutive normal Pap tests.  Hepatitis C blood test.** / For any individual with known risks for hepatitis C.  Skin self-exam. / Monthly.  Influenza vaccine. / Every year.  Tetanus, diphtheria, and acellular pertussis (Tdap, Td) vaccine.** / Consult your health care provider. Pregnant women should receive 1 dose of Tdap vaccine during each pregnancy. 1 dose of Td every 10 years.  Varicella vaccine.** / Consult your health care provider. Pregnant females who do not have evidence of immunity should receive the first dose after pregnancy.  HPV vaccine. / 3 doses over 6 months, if 93 and younger. The vaccine is not recommended for use in pregnant females. However, pregnancy testing is not needed before receiving a dose.  Measles, mumps, rubella (MMR) vaccine.** / You need at least 1 dose of MMR if you were born in 1957 or later. You may also need a 2nd dose. For females of childbearing age, rubella immunity should be determined. If there is no evidence of immunity, females who are not pregnant should be vaccinated. If there is no evidence of immunity, females who are  pregnant should delay immunization until after pregnancy.  Pneumococcal  13-valent conjugate (PCV13) vaccine.** / Consult your health care provider.  Pneumococcal polysaccharide (PPSV23) vaccine.** / 1 to 2 doses if you smoke cigarettes or if you have certain conditions.  Meningococcal vaccine.** / 1 dose if you are age 68 to 8 years and a Market researcher living in a residence hall, or have one of several medical conditions, you need to get vaccinated against meningococcal disease. You may also need additional booster doses.  Hepatitis A vaccine.** / Consult your health care provider.  Hepatitis B vaccine.** / Consult your health care provider.  Haemophilus influenzae type b (Hib) vaccine.** / Consult your health care provider. Ages 7 to 53 years  Blood pressure check.** / Every year.  Lipid and cholesterol check.** / Every 5 years beginning at age 25 years.  Lung cancer screening. / Every year if you are aged 11-80 years and have a 30-pack-year history of smoking and currently smoke or have quit within the past 15 years. Yearly screening is stopped once you have quit smoking for at least 15 years or develop a health problem that would prevent you from having lung cancer treatment.  Clinical breast exam.** / Every year after age 48 years.  BRCA-related cancer risk assessment.** / For women who have family members with a BRCA-related cancer (breast, ovarian, tubal, or peritoneal cancers).  Mammogram.** / Every year beginning at age 41 years and continuing for as long as you are in good health. Consult with your health care provider.  Pap test.** / Every 3 years starting at age 65 years through age 37 or 70 years with a history of 3 consecutive normal Pap tests.  HPV screening.** / Every 3 years from ages 72 years through ages 60 to 40 years with a history of 3 consecutive normal Pap tests.  Fecal occult blood test (FOBT) of stool. / Every year beginning at age 21 years and continuing until age 5 years. You may not need to do this test if you get  a colonoscopy every 10 years.  Flexible sigmoidoscopy or colonoscopy.** / Every 5 years for a flexible sigmoidoscopy or every 10 years for a colonoscopy beginning at age 35 years and continuing until age 48 years.  Hepatitis C blood test.** / For all people born from [birth year] through 1965 and any individual with known risks for hepatitis C.  Skin self-exam. / Monthly.  Influenza vaccine. / Every year.  Tetanus, diphtheria, and acellular pertussis (Tdap/Td) vaccine.** / Consult your health care provider. Pregnant women should receive 1 dose of Tdap vaccine during each pregnancy. 1 dose of Td every 10 years.  Varicella vaccine.** / Consult your health care provider. Pregnant females who do not have evidence of immunity should receive the first dose after pregnancy.  Zoster vaccine.** / 1 dose for adults aged 30 years or older.  Measles, mumps, rubella (MMR) vaccine.** / You need at least 1 dose of MMR if you were born in 1957 or later. You may also need a second dose. For females of childbearing age, rubella immunity should be determined. If there is no evidence of immunity, females who are not pregnant should be vaccinated. If there is no evidence of immunity, females who are pregnant should delay immunization until after pregnancy.  Pneumococcal 13-valent conjugate (PCV13) vaccine.** / Consult your health care provider.  Pneumococcal polysaccharide (PPSV23) vaccine.** / 1 to 2 doses if you smoke cigarettes or if you have certain conditions.  Meningococcal vaccine.** /  Consult your health care provider.  Hepatitis A vaccine.** / Consult your health care provider.  Hepatitis B vaccine.** / Consult your health care provider.  Haemophilus influenzae type b (Hib) vaccine.** / Consult your health care provider. Ages 82 years and over  Blood pressure check.** / Every year.  Lipid and cholesterol check.** / Every 5 years beginning at age 67 years.  Lung cancer screening. / Every year if you  are aged 73-80 years and have a 30-pack-year history of smoking and currently smoke or have quit within the past 15 years. Yearly screening is stopped once you have quit smoking for at least 15 years or develop a health problem that would prevent you from having lung cancer treatment.  Clinical breast exam.** / Every year after age 73 years.  BRCA-related cancer risk assessment.** / For women who have family members with a BRCA-related cancer (breast, ovarian, tubal, or peritoneal cancers).  Mammogram.** / Every year beginning at age 57 years and continuing for as long as you are in good health. Consult with your health care provider.  Pap test.** / Every 3 years starting at age 66 years through age 60 or 26 years with 3 consecutive normal Pap tests. Testing can be stopped between 65 and 70 years with 3 consecutive normal Pap tests and no abnormal Pap or HPV tests in the past 10 years.  HPV screening.** / Every 3 years from ages 57 years through ages 25 or 38 years with a history of 3 consecutive normal Pap tests. Testing can be stopped between 65 and 70 years with 3 consecutive normal Pap tests and no abnormal Pap or HPV tests in the past 10 years.  Fecal occult blood test (FOBT) of stool. / Every year beginning at age 109 years and continuing until age 65 years. You may not need to do this test if you get a colonoscopy every 10 years.  Flexible sigmoidoscopy or colonoscopy.** / Every 5 years for a flexible sigmoidoscopy or every 10 years for a colonoscopy beginning at age 2 years and continuing until age 43 years.  Hepatitis C blood test.** / For all people born from [birth year] through 1965 and any individual with known risks for hepatitis C.  Osteoporosis screening.** / A one-time screening for women ages 42 years and over and women at risk for fractures or osteoporosis.  Skin self-exam. / Monthly.   Influenza vaccine. / Every year.  Tetanus, diphtheria, and acellular pertussis (Tdap/Td)  vaccine.** / 1 dose of Td every 10 years.  Varicella vaccine.** / Consult your health care provider.  Zoster vaccine.** / 1 dose for adults aged 85 years or older.  Pneumococcal 13-valent conjugate (PCV13) vaccine.** / Consult your health care provider.  Pneumococcal polysaccharide (PPSV23) vaccine.** / 1 dose for all adults aged 40 years and older.  Meningococcal vaccine.** / Consult your health care provider.  Hepatitis A vaccine.** / Consult your health care provider.  Hepatitis B vaccine.** / Consult your health care provider.  Haemophilus influenzae type b (Hib) vaccine.** / Consult your health care provider. ** Family history and personal history of risk and conditions may change your health care provider's recommendations.   This information is not intended to replace advice given to you by your health care provider. Make sure you discuss any questions you have with your health care provider.   Document Released: 08/21/2001 Document Revised: 07/16/2014 Document Reviewed: 11/20/2010 Elsevier Interactive Patient Education [employer].  +

## 2015-10-17 NOTE — Progress Notes (Signed)
Patient ID: [name], female    DOB: September 13, 1960  Age: 55 y.o. MRN: [id number]    Subjective:  Subjective HPI [name] presents for cpe and co decreased hearing--- she would like her ears checked.    Review of Systems  Constitutional: Negative for diaphoresis, appetite change, fatigue and unexpected weight change.  HENT: Positive for hearing loss. Negative for congestion, ear discharge, ear pain, facial swelling, nosebleeds and postnasal drip.   Eyes: Negative for pain, redness and visual disturbance.  Respiratory: Negative for cough, chest tightness, shortness of breath and wheezing.   Cardiovascular: Negative for chest pain, palpitations and leg swelling.  Endocrine: Negative for cold intolerance, heat intolerance, polydipsia, polyphagia and polyuria.  Genitourinary: Negative for dysuria, frequency and difficulty urinating.  Skin: Negative for color change, pallor and rash.  Neurological: Negative for dizziness, light-headedness, numbness and headaches.  Psychiatric/Behavioral: Negative for hallucinations, confusion, dysphoric mood and decreased concentration.    History Past Medical History  Diagnosis Date  . Gestational HTN   . Melanoma ([place])     back  . Asthma     as a child  . Heart murmur   . Hyperlipidemia   . Allergy   . Anemia     SLIGHT AT LAST PCP OV  . Anxiety   . GERD (gastroesophageal reflux disease)     OCCASIONAL  . Diverticulosis     She has past surgical history that includes moles removed; nasal polyps; Cholecystectomy; and Colonoscopy.   Her family history includes Cancer in her maternal uncle and maternal uncle; Diabetes in her maternal grandfather; Heart disease (age of onset: 27) in her maternal grandfather; Hyperlipidemia in her maternal grandfather, maternal grandmother, and mother; Hypertension in her mother; Stroke in her maternal grandmother; Stroke (age of onset: 20) in her mother; Thyroid disease in her mother. There is no history  of Colon cancer, Rectal cancer, Stomach cancer, or Esophageal cancer.She reports that she has never smoked. She has never used smokeless tobacco. She reports that she does not drink alcohol or use illicit drugs.  Current Outpatient Prescriptions on File Prior to Visit  Medication Sig Dispense Refill  . ibuprofen (ADVIL,MOTRIN) 200 MG tablet Take 200 mg by mouth every 6 (six) hours as needed for pain.    . Ibuprofen (MIDOL PO) Take 1 capsule by mouth 4 (four) times daily as needed.    . loratadine (CLARITIN) 10 MG tablet Take 10 mg by mouth daily.    Marland Kitchen LORazepam (ATIVAN) 1 MG tablet Take 1 tablet (1 mg total) by mouth daily as needed. 90 tablet 0   No current facility-administered medications on file prior to visit.     Objective:  Objective Physical Exam  Constitutional: She is oriented to person, place, and time. She appears well-developed and well-nourished. No distress.  HENT:  Right Ear: External ear normal.  Left Ear: External ear normal.  Nose: Nose normal.  Mouth/Throat: Oropharynx is clear and moist.  Eyes: EOM are normal. Pupils are equal, round, and reactive to light.  Neck: Normal range of motion. Neck supple.  Cardiovascular: Normal rate, regular rhythm and normal heart sounds.   No murmur heard. Pulmonary/Chest: Effort normal and breath sounds normal. No respiratory distress. She has no wheezes. She has no rales. She exhibits no tenderness.  Neurological: She is alert and oriented to person, place, and time.  Psychiatric: She has a normal mood and affect. Her behavior is normal. Judgment and thought content normal.  Nursing note and vitals reviewed.  BP  110/72 mmHg  Pulse 73  Temp(Src) 97.6 F (36.4 C) (Oral)  Ht 5\' 1"  (1.549 m)  Wt 133 lb 9.6 oz (60.601 kg)  BMI 25.26 kg/m2  SpO2 99% Wt Readings from Last 3 Encounters:  10/17/15 133 lb 9.6 oz (60.601 kg)  10/28/14 126 lb 2 oz (57.21 kg)  10/14/14 130 lb 12.8 oz (59.33 kg)     Lab Results  Component Value  Date   WBC 6.7 10/14/2014   HGB 12.3 10/14/2014   HCT 36.0 10/14/2014   PLT 309.0 10/14/2014   GLUCOSE 83 10/14/2014   CHOL 209* 10/14/2014   TRIG 56.0 10/14/2014   HDL 54.90 10/14/2014   LDLDIRECT 169.7 05/20/2009   LDLCALC 143* 10/14/2014   ALT 17 10/14/2014   AST 18 10/14/2014   NA 136 10/14/2014   K 4.0 10/14/2014   CL 103 10/14/2014   CREATININE 0.77 10/14/2014   BUN 24* 10/14/2014   CO2 27 10/14/2014   TSH 1.37 10/14/2014   HGBA1C 5.7 03/24/2008    Dg Abd 1 View  10/28/2014  CLINICAL DATA:  Pre lithotripsy LEFT sided stone. EXAM: ABDOMEN - 1 VIEW COMPARISON:  CT of the abdomen and pelvis August 25, 2014 FINDINGS: Approximate 11 mm calculus projects in LEFT upper quadrant. Surgical clips in the included right abdomen likely reflect cholecystectomy. Bowel gas pattern is nondilated and nonobstructive. No intra-abdominal mass effect. Limited assess bar for free air on this supine view. Soft tissue planes and included osseous structure nonsuspicious. IMPRESSION: Approximate 11 mm calculus projects in LEFT upper quadrant likely representing known LEFT renal pelvis calculus on prior CT. Electronically Signed   By: Elon Alas   On: 10/28/2014 06:29     Assessment & Plan:  Plan I am having Ms. Leete maintain her loratadine, ibuprofen, Ibuprofen (MIDOL PO), LORazepam, SUMAtriptan, and omeprazole.  Meds ordered this encounter  Medications  . SUMAtriptan (IMITREX) 100 MG tablet    Sig: USE AS DIRECTED    Dispense:  36 tablet    Refill:  1  . omeprazole (PRILOSEC) 40 MG capsule    Sig: Take 1 capsule (40 mg total) by mouth daily.    Dispense:  90 capsule    Refill:  3    Problem List Items Addressed This Visit      Unprioritized   Esophageal reflux   Relevant Medications   omeprazole (PRILOSEC) 40 MG capsule    Other Visit Diagnoses    Preventative health care    -  Primary    Relevant Orders    Comprehensive metabolic panel    Lipid panel    CBC with  Differential/Platelet    POCT urinalysis dipstick    TSH    Other type of migraine        Relevant Medications    SUMAtriptan (IMITREX) 100 MG tablet    Need for hepatitis C screening test        Relevant Orders    Hepatitis C antibody    Screening for HIV (human immunodeficiency virus)        Relevant Orders    HIV antibody       Follow-up: Return if symptoms worsen or fail to improve.  Ann Held, DO

## 2015-10-17 NOTE — Progress Notes (Signed)
Pre visit review using our clinic review tool, if applicable. No additional management support is needed unless otherwise documented below in the visit note. 

## 2015-10-17 NOTE — Progress Notes (Signed)
Subjective:     [name] is a 55 y.o. female and is here for a comprehensive physical exam. The patient reports problems - decreased hearing--wants ears checked.  Social History   Social History  . Marital Status: Married    Spouse Name: N/A  . Number of Children: 1  . Years of Education: N/A   Occupational History  . 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 Smoking status: Never Smoker   . Smokeless tobacco: Never Used  . Alcohol Use: No  . Drug Use: No  . Sexual Activity:    Partners: Male   Other Topics Concern  . Not on file   Social History Narrative   Exercise--walk, daily   Health Maintenance  Topic Date Due  . Hepatitis C Screening  04-Dec-1960  . HIV Screening  11/28/1975  . INFLUENZA VACCINE  02/07/2016  . MAMMOGRAM  05/03/2016  . PAP SMEAR  10/12/2016  . TETANUS/TDAP  03/20/2017  . COLONOSCOPY  09/26/2022    The following portions of the patient's history were reviewed and updated as appropriate:  She  has a past medical history of Gestational HTN; Melanoma ([place]); Asthma; Heart murmur; Hyperlipidemia; Allergy; Anemia; Anxiety; GERD (gastroesophageal reflux disease); and Diverticulosis. She  does not have any pertinent problems on file. She  has past surgical history that includes moles removed; nasal polyps; Cholecystectomy; and Colonoscopy. Her family history includes Cancer in her maternal uncle and maternal uncle; Diabetes in her maternal grandfather; Heart disease (age of onset: 52) in her maternal grandfather; Hyperlipidemia in her maternal grandfather, maternal grandmother, and mother; Hypertension in her mother; Stroke in her maternal grandmother; Stroke (age of onset: 54) in her mother; Thyroid disease in her mother. There is no history of Colon cancer, Rectal cancer, Stomach cancer, or Esophageal cancer. She  reports that she has never smoked. She has never used smokeless tobacco. She reports that she does not  drink alcohol or use illicit drugs. She has a current medication list which includes the following prescription(s): ibuprofen, ibuprofen, loratadine, lorazepam, omeprazole, and sumatriptan. Current Outpatient Prescriptions on File Prior to Visit  Medication Sig Dispense Refill  . ibuprofen (ADVIL,MOTRIN) 200 MG tablet Take 200 mg by mouth every 6 (six) hours as needed for pain.    . Ibuprofen (MIDOL PO) Take 1 capsule by mouth 4 (four) times daily as needed.    . loratadine (CLARITIN) 10 MG tablet Take 10 mg by mouth daily.    Marland Kitchen LORazepam (ATIVAN) 1 MG tablet Take 1 tablet (1 mg total) by mouth daily as needed. 90 tablet 0   No current facility-administered medications on file prior to visit.   She has No Known Allergies..  Review of Systems Review of Systems  Constitutional: Negative for activity change, appetite change and fatigue.  HENT: Negative for hearing loss, congestion, tinnitus and ear discharge.  dentist q60m Eyes: Negative for visual disturbance (see optho q1y -- vision corrected to 20/20 with glasses).  Respiratory: Negative for cough, chest tightness and shortness of breath.   Cardiovascular: Negative for chest pain, palpitations and leg swelling.  Gastrointestinal: Negative for abdominal pain, diarrhea, constipation and abdominal distention.  Genitourinary: Negative for urgency, frequency, decreased urine volume and difficulty urinating.  Musculoskeletal: Negative for back pain, arthralgias and gait problem.  Skin: Negative for color change, pallor and rash.  Neurological: Negative for dizziness, light-headedness, numbness and headaches.  Hematological: Negative for adenopathy. Does not bruise/bleed easily.  Psychiatric/Behavioral: Negative for suicidal ideas,  confusion, sleep disturbance, self-injury, dysphoric mood, decreased concentration and agitation.       Objective:    BP 110/72 mmHg  Pulse 73  Temp(Src) 97.6 F (36.4 C) (Oral)  Ht 5\' 1"  (1.549 m)  Wt 133  lb 9.6 oz (60.601 kg)  BMI 25.26 kg/m2  SpO2 99% General appearance: alert, cooperative, appears stated age and no distress Head: Normocephalic, without obvious abnormality, atraumatic Eyes: conjunctivae/corneas clear. PERRL, EOM's intact. Fundi benign. Ears: normal TM's and external ear canals both ears Nose: Nares normal. Septum midline. Mucosa normal. No drainage or sinus tenderness. Throat: lips, mucosa, and tongue normal; teeth and gums normal Neck: no adenopathy, no carotid bruit, no JVD, supple, symmetrical, trachea midline and thyroid not enlarged, symmetric, no tenderness/mass/nodules Back: symmetric, no curvature. ROM normal. No CVA tenderness. Lungs: clear to auscultation bilaterally Breasts: normal appearance, no masses or tenderness Heart: regular rate and rhythm, S1, S2 normal, no murmur, click, rub or gallop Abdomen: soft, non-tender; bowel sounds normal; no masses,  no organomegaly Pelvic: deferred Extremities: extremities normal, atraumatic, no cyanosis or edema Pulses: 2+ and symmetric Skin: Skin color, texture, turgor normal. No rashes or lesions Lymph nodes: Cervical, supraclavicular, and axillary nodes normal. Neurologic: Alert and oriented X 3, normal strength and tone. Normal symmetric reflexes. Normal coordination and gait Psych- no depression, no anxiety      Assessment:    Healthy female exam.      Plan:    ghm utd  Check labs See After Visit Summary for Counseling Recommendations   1. Other type of migraine stable - SUMAtriptan (IMITREX) 100 MG tablet; USE AS DIRECTED  Dispense: 36 tablet; Refill: 1  2. Gastroesophageal reflux disease, esophagitis presence not specified  - omeprazole (PRILOSEC) 40 MG capsule; Take 1 capsule (40 mg total) by mouth daily.  Dispense: 90 capsule; Refill: 3  3. Preventative health care ghm utd Check labs See avs - Comprehensive metabolic panel - Lipid panel - CBC with Differential/Platelet - POCT urinalysis  dipstick - TSH  4. Need for hepatitis C screening test   - Hepatitis C antibody  5. Screening for HIV (human immunodeficiency virus)   - HIV antibody

## 2016-01-25 ENCOUNTER — Encounter: Payer: Self-pay | Admitting: Family Medicine

## 2016-02-14 ENCOUNTER — Other Ambulatory Visit: Payer: Self-pay | Admitting: Family Medicine

## 2016-02-14 DIAGNOSIS — Z1231 Encounter for screening mammogram for malignant neoplasm of breast: Secondary | ICD-10-CM

## 2016-02-23 ENCOUNTER — Ambulatory Visit
Admission: RE | Admit: 2016-02-23 | Discharge: 2016-02-23 | Disposition: A | Discharge status: Home / Self Care | Payer: BC Managed Care – PPO | Source: Ambulatory Visit | Attending: Family Medicine | Admitting: Family Medicine

## 2016-02-23 DIAGNOSIS — Z1231 Encounter for screening mammogram for malignant neoplasm of breast: Secondary | ICD-10-CM

## 2016-04-20 ENCOUNTER — Encounter: Payer: Self-pay | Admitting: Family Medicine

## 2016-07-11 ENCOUNTER — Ambulatory Visit (INDEPENDENT_AMBULATORY_CARE_PROVIDER_SITE_OTHER): Payer: BC Managed Care – PPO | Admitting: Internal Medicine

## 2016-07-11 ENCOUNTER — Encounter: Payer: Self-pay | Admitting: Internal Medicine

## 2016-07-11 ENCOUNTER — Ambulatory Visit (HOSPITAL_BASED_OUTPATIENT_CLINIC_OR_DEPARTMENT_OTHER)
Admission: RE | Admit: 2016-07-11 | Discharge: 2016-07-11 | Disposition: A | Discharge status: Home / Self Care | Payer: BC Managed Care – PPO | Source: Ambulatory Visit | Attending: Internal Medicine | Admitting: Internal Medicine

## 2016-07-11 VITALS — BP 122/72 | HR 83 | Resp 14 | Ht 61.0 in | Wt 123.5 lb

## 2016-07-11 DIAGNOSIS — R51 Headache: Secondary | ICD-10-CM

## 2016-07-11 DIAGNOSIS — J322 Chronic ethmoidal sinusitis: Secondary | ICD-10-CM | POA: Insufficient documentation

## 2016-07-11 DIAGNOSIS — R112 Nausea with vomiting, unspecified: Secondary | ICD-10-CM | POA: Diagnosis not present

## 2016-07-11 DIAGNOSIS — R519 Headache, unspecified: Secondary | ICD-10-CM

## 2016-07-11 MED ORDER — AZELASTINE HCL 0.1 % NA SOLN: 2.0000 | Freq: Two times a day (BID) | NASAL | 6 refills | Status: DC

## 2016-07-11 MED ORDER — ONDANSETRON 4 MG PO TBDP: 4.0000 mg | ORAL_TABLET | Freq: Three times a day (TID) | ORAL | 0 refills | Status: DC | PRN

## 2016-07-11 MED ORDER — ONDANSETRON 4 MG PO TBDP
4.0000 mg | ORAL_TABLET | Freq: Once | ORAL | Status: AC
  Administered 2016-07-11: 4 mg via ORAL

## 2016-07-11 NOTE — Patient Instructions (Signed)
We'll get a CT today  Rest  Takes  zofran for nausea as needed. Try to drink plenty of clear fluids, Gatorade, ginger ale , follow-up bland diet.   For headaches use Tylenol or Motrin with food as needed  Try another Imitrex today, followed by a second dose if no better  Call if you are not improving in the next 48 hours  Go to the ER if fever, chills, rash, neck stiffness , increasingly severe symptoms.

## 2016-07-11 NOTE — Progress Notes (Signed)
Subjective:    Patient ID: [name], female    DOB: [birth year], 56 y.o.   MRN: [id number]  DOS:  07/11/2016 Type of visit - description : acute, CC nausea, here w/ husband Interval history: Yesterday, she developed a headache along with nausea and vomiting. The headache is mostly~ the forehead, very intense, when asked if this was the worse headache of her life she said yes. Yesterday, tried Imitrex and it didn't help much, tried ibuprofen and it helped some. Was unable to keep down a second dose of Imitrex this morning. vomited about 6 times yesterday, did vomit once in the office, received sublingual  Zofran and is feeling better at the time of the exam. Current HA is different from her usual migraines, migraines are less  intense and not associated with nausea. Also for the last 10 days her nose has been quite stopped up and she wonders if she has sinusitis.   Review of Systems Denies fever chills No neck stiffness No rash No abdominal pain, diarrhea No head injury No dysuria, gross hematuria or flank pain. No cough or chest congestion.  Past Medical History:  Diagnosis Date  . Allergy   . Anemia    SLIGHT AT LAST PCP OV  . Anxiety   . Asthma    as a child  . Diverticulosis   . GERD (gastroesophageal reflux disease)    OCCASIONAL  . Gestational HTN   . Heart murmur   . Hyperlipidemia   . Melanoma ([place])    back    Past Surgical History:  Procedure Laterality Date  . CHOLECYSTECTOMY    . COLONOSCOPY    . moles removed    . nasal polyps      Social History   Social History  . Marital status: Married    Spouse name: N/A  . Number of children: 1  . Years of education: N/A   Occupational History  . 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 Smoking status: Never Smoker  . Smokeless tobacco: Never Used  . Alcohol use No  . Drug use: No  . Sexual activity: Yes    Partners: Male   Other Topics Concern  . Not on  file   Social History Narrative   Exercise--walk, daily      Allergies as of 07/11/2016   No Known Allergies     Medication List       Accurate as of 07/11/16 11:59 PM. Always use your most recent med list.          azelastine 0.1 % nasal spray Commonly known as:  ASTELIN Place 2 sprays into both nostrils 2 (two) times daily.   ibuprofen 200 MG tablet Commonly known as:  ADVIL,MOTRIN Take 200 mg by mouth every 6 (six) hours as needed for pain.   loratadine 10 MG tablet Commonly known as:  CLARITIN Take 10 mg by mouth daily.   LORazepam 1 MG tablet Commonly known as:  ATIVAN Take 1 tablet (1 mg total) by mouth daily as needed.   MIDOL PO Take 1 capsule by mouth 4 (four) times daily as needed.   omeprazole 40 MG capsule Commonly known as:  PRILOSEC Take 1 capsule (40 mg total) by mouth daily.   ondansetron 4 MG disintegrating tablet Commonly known as:  ZOFRAN-ODT Take 1-2 tablets (4-8 mg total) by mouth 3 (three) times daily as needed for nausea or vomiting.   SUMAtriptan 100 MG tablet Commonly  known as:  IMITREX USE AS DIRECTED          Objective:   Physical Exam BP 122/72 (BP Location: Left Arm, Patient Position: Sitting, Cuff Size: Small)   Pulse 83   Resp 14   Ht 5\' 1"  (1.549 m)   Wt 123 lb 8 oz (56 kg)   SpO2 97%   BMI 23.34 kg/m  General:   Well developed, well nourished. She had nausea and vomited 1, got Zofran, now looks better, in no distress HEENT --Normocephalic . Face symmetric, atraumatic. TMs normal, throat symmetric, nose is slightly to moderately congested. maxilary sinuses no TTP Lungs:  CTA B Normal respiratory effort, no intercostal retractions, no accessory muscle use. Heart: RRR,  no murmur.  no pretibial edema bilaterally  Abdomen:  Not distended, soft, non-tender. No rebound or rigidity.  Skin: Not pale. Not jaundice Neurologic:  alert & oriented X3. Speech normal, gait appropriate for age and unassisted. DTRs and motor  symmetric. EOMI Neck is full range of motion. Supple Psych--  Cognition and judgment appear intact.  Cooperative with normal attention span and concentration.  Behavior appropriate. No anxious or depressed appearing.    Assessment & Plan:    56 year old female history of hyperlipidemia, gestational hypertension, GERD, migraines, asthma, anxiety, kidney stones, melanoma presents with : Nausea, vomiting, headache for approximately 24 hours. Also nasal congestion for 10 days: Describes  her headache is different from migraines and worse of her life. DDX includes a intracranial process v. viral syndrome (some people in the community w/ sx of a stomach virus) v. an atypical migraine. Sinusitis is less likely. Plan: CT head now, conservative treatment with zofran, Tylenol, Motrin, Imitrex. Astelin for nasal congestion. See instructions. Consider further evaluation or antibiotics. Addendum: CT  report reviewed and d/w pt (see below) : no acute findings to explain HAs, plan is the same but needs f/u next for a check up w/ PCP or me, sooner if not improving. Consider abx for sinus dz or neuro eval due to enlarged ventricles . Pt in agreement  ------ 1. Prominent ventricles for age. Although this may be within normal alignment, communicating hydrocephalus cannot be excluded. 2. Ethmoid sinus disease. 3. No acute intracranial abnormality.

## 2016-07-11 NOTE — Progress Notes (Signed)
Pre visit review using our clinic review tool, if applicable. No additional management support is needed unless otherwise documented below in the visit note. 

## 2016-07-13 ENCOUNTER — Telehealth: Payer: Self-pay | Admitting: Internal Medicine

## 2016-07-13 NOTE — Telephone Encounter (Signed)
LMOM asking for call back.  

## 2016-07-13 NOTE — Telephone Encounter (Signed)
Was seen a couple of days ago with severe headache, please patient, see how she's doing.  Better, worse? Fever, diarrhea?. Let me know.

## 2016-07-17 NOTE — Telephone Encounter (Signed)
Opened in error

## 2016-10-22 ENCOUNTER — Encounter: Payer: BC Managed Care – PPO | Admitting: Family Medicine

## 2016-10-29 ENCOUNTER — Other Ambulatory Visit (HOSPITAL_COMMUNITY)
Admission: RE | Admit: 2016-10-29 | Discharge: 2016-10-29 | Disposition: A | Discharge status: Home / Self Care | Payer: BC Managed Care – PPO | Source: Ambulatory Visit | Attending: Family Medicine | Admitting: Family Medicine

## 2016-10-29 ENCOUNTER — Ambulatory Visit (INDEPENDENT_AMBULATORY_CARE_PROVIDER_SITE_OTHER): Payer: BC Managed Care – PPO | Admitting: Family Medicine

## 2016-10-29 ENCOUNTER — Encounter: Payer: Self-pay | Admitting: Family Medicine

## 2016-10-29 VITALS — BP 116/70 | HR 72 | Temp 98.1°F | Resp 16 | Ht 60.2 in | Wt 127.4 lb

## 2016-10-29 DIAGNOSIS — E782 Mixed hyperlipidemia: Secondary | ICD-10-CM | POA: Diagnosis not present

## 2016-10-29 DIAGNOSIS — Z0000 Encounter for general adult medical examination without abnormal findings: Secondary | ICD-10-CM

## 2016-10-29 DIAGNOSIS — Z23 Encounter for immunization: Secondary | ICD-10-CM | POA: Diagnosis not present

## 2016-10-29 DIAGNOSIS — I10 Essential (primary) hypertension: Secondary | ICD-10-CM | POA: Insufficient documentation

## 2016-10-29 DIAGNOSIS — F419 Anxiety disorder, unspecified: Secondary | ICD-10-CM

## 2016-10-29 MED ORDER — SUMATRIPTAN SUCCINATE 100 MG PO TABS: ORAL_TABLET | ORAL | 1 refills | Status: DC

## 2016-10-29 MED ORDER — LORAZEPAM 1 MG PO TABS: 1.0000 mg | ORAL_TABLET | Freq: Every day | ORAL | 0 refills | Status: DC | PRN

## 2016-10-29 NOTE — Patient Instructions (Signed)

## 2016-10-29 NOTE — Progress Notes (Signed)
Pre visit review using our clinic review tool, if applicable. No additional management support is needed unless otherwise documented below in the visit note. 

## 2016-10-29 NOTE — Progress Notes (Signed)
Subjective:     [name] is a 56 y.o. female and is here for a comprehensive physical exam. The patient reports no problems.  Social History   Social History  . Marital status: Married    Spouse name: N/A  . Number of children: 1  . Years of education: N/A   Occupational History  . teachers assistant [employer] History Main Topics  . Smoking status: Never Smoker  . Smokeless tobacco: Never Used  . Alcohol use No  . Drug use: No  . Sexual activity: Yes    Partners: Male   Other Topics Concern  . Not on file   Social History Narrative   Exercise--walk, daily   Health Maintenance  Topic Date Due  . PAP SMEAR  10/12/2016  . INFLUENZA VACCINE  02/06/2017  . TETANUS/TDAP  03/20/2017  . MAMMOGRAM  02/22/2018  . COLONOSCOPY  09/26/2022  . Hepatitis C Screening  Completed  . HIV Screening  Completed    The following portions of the patient's history were reviewed and updated as appropriate:  She  has a past medical history of Allergy; Anemia; Anxiety; Asthma; Diverticulosis; GERD (gastroesophageal reflux disease); Gestational HTN; Heart murmur; Hyperlipidemia; and Melanoma ([place]). She  does not have any pertinent problems on file. She  has a past surgical history that includes moles removed; nasal polyps; Cholecystectomy; and Colonoscopy. Her family history includes Cancer in her maternal uncle and maternal uncle; Diabetes in her maternal grandfather; Heart disease (age of onset: 92) in her maternal grandfather; Hyperlipidemia in her maternal grandfather, maternal grandmother, and mother; Hypertension in her mother; Stroke in her maternal grandmother; Stroke (age of onset: 80) in her mother; Thyroid disease in her mother. She  reports that she has never smoked. She has never used smokeless tobacco. She reports that she does not drink alcohol or use drugs. She has a current medication list which includes the following prescription(s): azelastine,  ibuprofen, acetaminophen, loratadine, lorazepam, omeprazole, ondansetron, and sumatriptan. Current Outpatient Prescriptions on File Prior to Visit  Medication Sig Dispense Refill  . azelastine (ASTELIN) 0.1 % nasal spray Place 2 sprays into both nostrils 2 (two) times daily. 30 mL 6  . ibuprofen (ADVIL,MOTRIN) 200 MG tablet Take 200 mg by mouth every 6 (six) hours as needed for pain.    . Ibuprofen (MIDOL PO) Take 1 capsule by mouth 4 (four) times daily as needed.    . loratadine (CLARITIN) 10 MG tablet Take 10 mg by mouth daily.    Marland Kitchen omeprazole (PRILOSEC) 40 MG capsule Take 1 capsule (40 mg total) by mouth daily. 90 capsule 3  . ondansetron (ZOFRAN-ODT) 4 MG disintegrating tablet Take 1-2 tablets (4-8 mg total) by mouth 3 (three) times daily as needed for nausea or vomiting. 20 tablet 0   No current facility-administered medications on file prior to visit.    She has No Known Allergies..  Review of Systems Review of Systems  Constitutional: Negative for activity change, appetite change and fatigue.  HENT: Negative for hearing loss, congestion, tinnitus and ear discharge.  dentist q68m Eyes: Negative for visual disturbance (see optho q1y -- vision corrected to 20/20 with glasses).  Respiratory: Negative for cough, chest tightness and shortness of breath.   Cardiovascular: Negative for chest pain, palpitations and leg swelling.  Gastrointestinal: Negative for abdominal pain, diarrhea, constipation and abdominal distention.  Genitourinary: Negative for urgency, frequency, decreased urine volume and difficulty urinating.  Musculoskeletal: Negative for back pain, arthralgias and gait  problem.  Skin: Negative for color change, pallor and rash.  Neurological: Negative for dizziness, light-headedness, numbness and headaches.  Hematological: Negative for adenopathy. Does not bruise/bleed easily.  Psychiatric/Behavioral: Negative for suicidal ideas, confusion, sleep disturbance, self-injury,  dysphoric mood, decreased concentration and agitation.       Objective:    BP 116/70 (BP Location: Left Arm, Cuff Size: Normal)   Pulse 72   Temp 98.1 F (36.7 C) (Oral)   Resp 16   Ht 5' 0.2" (1.529 m)   Wt 127 lb 6.4 oz (57.8 kg)   SpO2 98%   BMI 24.72 kg/m  General appearance: alert, cooperative, appears stated age and no distress Head: Normocephalic, without obvious abnormality, atraumatic Eyes: conjunctivae/corneas clear. PERRL, EOM's intact. Fundi benign. Ears: normal TM's and external ear canals both ears Nose: Nares normal. Septum midline. Mucosa normal. No drainage or sinus tenderness. Throat: lips, mucosa, and tongue normal; teeth and gums normal Neck: no adenopathy, no carotid bruit, no JVD, supple, symmetrical, trachea midline and thyroid not enlarged, symmetric, no tenderness/mass/nodules Back: symmetric, no curvature. ROM normal. No CVA tenderness. Lungs: clear to auscultation bilaterally Breasts: normal appearance, no masses or tenderness Heart: regular rate and rhythm, S1, S2 normal, no murmur, click, rub or gallop Abdomen: soft, non-tender; bowel sounds normal; no masses,  no organomegaly Pelvic: cervix normal in appearance, external genitalia normal, no adnexal masses or tenderness, no cervical motion tenderness, rectovaginal septum normal, uterus normal size, shape, and consistency, vagina normal without discharge and pap done,  rectal heme neg brown stool Extremities: extremities normal, atraumatic, no cyanosis or edema Pulses: 2+ and symmetric Skin: Skin color, texture, turgor normal. No rashes or lesions Lymph nodes: Cervical, supraclavicular, and axillary nodes normal. Neurologic: Alert and oriented X 3, normal strength and tone. Normal symmetric reflexes. Normal coordination and gait    Assessment:    Healthy female exam.      Plan:    ghm utd Check labs See After Visit Summary for Counseling Recommendations    1. Anxiety stable - LORazepam  (ATIVAN) 1 MG tablet; Take 1 tablet (1 mg total) by mouth daily as needed.  Dispense: 90 tablet; Refill: 0  2. Mixed hyperlipidemia Check labs Encouraged heart healthy diet, increase exercise, avoid trans fats, consider a krill oil cap daily  - Lipid panel - Lipid panel - Comprehensive metabolic panel  3. Essential hypertension Well controlled, no changes to meds. Encouraged heart healthy diet such as the DASH diet and exercise as tolerated.   - CBC - Comprehensive metabolic panel - Comprehensive metabolic panel  4. Preventative health care See above - CBC with Differential/Platelet - Lipid panel - TSH - Comprehensive metabolic panel - POCT Urinalysis Dipstick (Automated) - Cytology - PAP

## 2016-10-30 LAB — CBC WITH DIFFERENTIAL/PLATELET
BASOS ABS: 0.1 10*3/uL (ref 0.0–0.1)
BASOS PCT: 0.9 % (ref 0.0–3.0)
EOS ABS: 0.4 10*3/uL (ref 0.0–0.7)
Eosinophils Relative: 5.8 % — ABNORMAL HIGH (ref 0.0–5.0)
HEMATOCRIT: 35.4 % — AB (ref 36.0–46.0)
Hemoglobin: 12 g/dL (ref 12.0–15.0)
Lymphocytes Relative: 35.8 % (ref 12.0–46.0)
Lymphs Abs: 2.3 10*3/uL (ref 0.7–4.0)
MCHC: 33.8 g/dL (ref 30.0–36.0)
MCV: 92 fl (ref 78.0–100.0)
MONO ABS: 0.5 10*3/uL (ref 0.1–1.0)
Monocytes Relative: 7.1 % (ref 3.0–12.0)
Neutro Abs: 3.3 10*3/uL (ref 1.4–7.7)
Neutrophils Relative %: 50.4 % (ref 43.0–77.0)
Platelets: 316 10*3/uL (ref 150.0–400.0)
RBC: 3.85 Mil/uL — ABNORMAL LOW (ref 3.87–5.11)
RDW: 13.9 % (ref 11.5–15.5)
WBC: 6.6 10*3/uL (ref 4.0–10.5)

## 2016-10-30 LAB — COMPREHENSIVE METABOLIC PANEL
ALBUMIN: 4.2 g/dL (ref 3.5–5.2)
ALT: 21 U/L (ref 0–35)
AST: 22 U/L (ref 0–37)
Alkaline Phosphatase: 68 U/L (ref 39–117)
BUN: 19 mg/dL (ref 6–23)
CALCIUM: 9.4 mg/dL (ref 8.4–10.5)
CHLORIDE: 106 meq/L (ref 96–112)
CO2: 29 mEq/L (ref 19–32)
Creatinine, Ser: 0.77 mg/dL (ref 0.40–1.20)
GFR: 82.44 mL/min (ref >60.00)
Glucose, Bld: 83 mg/dL (ref 70–99)
Potassium: 4.2 mEq/L (ref 3.5–5.1)
Sodium: 140 mEq/L (ref 135–145)
Total Bilirubin: 0.3 mg/dL (ref 0.2–1.2)
Total Protein: 6.8 g/dL (ref 6.0–8.3)

## 2016-10-30 LAB — LIPID PANEL
Cholesterol: 200 mg/dL (ref 0–200)
HDL: 58 mg/dL (ref >39.00)
LDL CALC: 133 mg/dL — AB (ref 0–99)
NonHDL: 142.33
Total CHOL/HDL Ratio: 3
Triglycerides: 49 mg/dL (ref 0.0–149.0)
VLDL: 9.8 mg/dL (ref 0.0–40.0)

## 2016-10-30 LAB — TSH: TSH: 1.16 u[IU]/mL (ref 0.35–4.50)

## 2016-10-31 LAB — CYTOLOGY - PAP
DIAGNOSIS: NEGATIVE
HPV: NOT DETECTED

## 2017-02-11 ENCOUNTER — Other Ambulatory Visit: Payer: Self-pay | Admitting: Family Medicine

## 2017-02-11 DIAGNOSIS — K219 Gastro-esophageal reflux disease without esophagitis: Secondary | ICD-10-CM

## 2017-04-07 ENCOUNTER — Encounter: Payer: Self-pay | Admitting: Family Medicine

## 2017-04-08 ENCOUNTER — Other Ambulatory Visit: Payer: Self-pay | Admitting: Family Medicine

## 2017-04-08 DIAGNOSIS — Z1231 Encounter for screening mammogram for malignant neoplasm of breast: Secondary | ICD-10-CM

## 2017-04-11 ENCOUNTER — Ambulatory Visit: Payer: BC Managed Care – PPO | Admitting: Family Medicine

## 2017-04-11 ENCOUNTER — Ambulatory Visit (INDEPENDENT_AMBULATORY_CARE_PROVIDER_SITE_OTHER): Payer: BC Managed Care – PPO

## 2017-04-11 DIAGNOSIS — Z23 Encounter for immunization: Secondary | ICD-10-CM | POA: Diagnosis not present

## 2017-04-17 ENCOUNTER — Ambulatory Visit
Admission: RE | Admit: 2017-04-17 | Discharge: 2017-04-17 | Disposition: A | Discharge status: Home / Self Care | Payer: BC Managed Care – PPO | Source: Ambulatory Visit | Attending: Family Medicine | Admitting: Family Medicine

## 2017-04-17 DIAGNOSIS — Z1231 Encounter for screening mammogram for malignant neoplasm of breast: Secondary | ICD-10-CM

## 2017-07-15 ENCOUNTER — Other Ambulatory Visit: Payer: Self-pay | Admitting: Family Medicine

## 2017-10-31 ENCOUNTER — Encounter: Payer: Self-pay | Admitting: Family Medicine

## 2017-10-31 ENCOUNTER — Ambulatory Visit (INDEPENDENT_AMBULATORY_CARE_PROVIDER_SITE_OTHER): Payer: BC Managed Care – PPO | Admitting: Family Medicine

## 2017-10-31 VITALS — BP 128/68 | HR 70 | Resp 16 | Ht 60.2 in | Wt 124.2 lb

## 2017-10-31 DIAGNOSIS — Z23 Encounter for immunization: Secondary | ICD-10-CM

## 2017-10-31 DIAGNOSIS — G43809 Other migraine, not intractable, without status migrainosus: Secondary | ICD-10-CM

## 2017-10-31 DIAGNOSIS — Z0000 Encounter for general adult medical examination without abnormal findings: Secondary | ICD-10-CM

## 2017-10-31 LAB — POC URINALSYSI DIPSTICK (AUTOMATED)
BILIRUBIN UA: NEGATIVE
Glucose, UA: NEGATIVE
KETONES UA: NEGATIVE
LEUKOCYTES UA: NEGATIVE
Nitrite, UA: NEGATIVE
PROTEIN UA: NEGATIVE
RBC UA: NEGATIVE
Urobilinogen, UA: 0.2 E.U./dL
pH, UA: 6 (ref 5.0–8.0)

## 2017-10-31 MED ORDER — SUMATRIPTAN SUCCINATE 100 MG PO TABS: ORAL_TABLET | ORAL | 1 refills | Status: DC

## 2017-10-31 NOTE — Patient Instructions (Signed)
Preventive Care 40-64 Years, Female Preventive care refers to lifestyle choices and visits with your health care provider that can promote health and wellness. What does preventive care include?  A yearly physical exam. This is also called an annual well check.  Dental exams once or twice a year.  Routine eye exams. Ask your health care provider how often you should have your eyes checked.  Personal lifestyle choices, including: ? Daily care of your teeth and gums. ? Regular physical activity. ? Eating a healthy diet. ? Avoiding tobacco and drug use. ? Limiting alcohol use. ? Practicing safe sex. ? Taking low-dose aspirin daily starting at age 58. ? Taking vitamin and mineral supplements as recommended by your health care provider. What happens during an annual well check? The services and screenings done by your health care provider during your annual well check will depend on your age, overall health, lifestyle risk factors, and family history of disease. Counseling Your health care provider may ask you questions about your:  Alcohol use.  Tobacco use.  Drug use.  Emotional well-being.  Home and relationship well-being.  Sexual activity.  Eating habits.  Work and work Statistician.  Method of birth control.  Menstrual cycle.  Pregnancy history.  Screening You may have the following tests or measurements:  Height, weight, and BMI.  Blood pressure.  Lipid and cholesterol levels. These may be checked every 5 years, or more frequently if you are over 81 years old.  Skin check.  Lung cancer screening. You may have this screening every year starting at age 78 if you have a 30-pack-year history of smoking and currently smoke or have quit within the past 15 years.  Fecal occult blood test (FOBT) of the stool. You may have this test every year starting at age 65.  Flexible sigmoidoscopy or colonoscopy. You may have a sigmoidoscopy every 5 years or a colonoscopy  every 10 years starting at age 30.  Hepatitis C blood test.  Hepatitis B blood test.  Sexually transmitted disease (STD) testing.  Diabetes screening. This is done by checking your blood sugar (glucose) after you have not eaten for a while (fasting). You may have this done every 1-3 years.  Mammogram. This may be done every 1-2 years. Talk to your health care provider about when you should start having regular mammograms. This may depend on whether you have a family history of breast cancer.  BRCA-related cancer screening. This may be done if you have a family history of breast, ovarian, tubal, or peritoneal cancers.  Pelvic exam and Pap test. This may be done every 3 years starting at age 80. Starting at age 36, this may be done every 5 years if you have a Pap test in combination with an HPV test.  Bone density scan. This is done to screen for osteoporosis. You may have this scan if you are at high risk for osteoporosis.  Discuss your test results, treatment options, and if necessary, the need for more tests with your health care provider. Vaccines Your health care provider may recommend certain vaccines, such as:  Influenza vaccine. This is recommended every year.  Tetanus, diphtheria, and acellular pertussis (Tdap, Td) vaccine. You may need a Td booster every 10 years.  Varicella vaccine. You may need this if you have not been vaccinated.  Zoster vaccine. You may need this after age 5.  Measles, mumps, and rubella (MMR) vaccine. You may need at least one dose of MMR if you were born in  1957 or later. You may also need a second dose.  Pneumococcal 13-valent conjugate (PCV13) vaccine. You may need this if you have certain conditions and were not previously vaccinated.  Pneumococcal polysaccharide (PPSV23) vaccine. You may need one or two doses if you smoke cigarettes or if you have certain conditions.  Meningococcal vaccine. You may need this if you have certain  conditions.  Hepatitis A vaccine. You may need this if you have certain conditions or if you travel or work in places where you may be exposed to hepatitis A.  Hepatitis B vaccine. You may need this if you have certain conditions or if you travel or work in places where you may be exposed to hepatitis B.  Haemophilus influenzae type b (Hib) vaccine. You may need this if you have certain conditions.  Talk to your health care provider about which screenings and vaccines you need and how often you need them. This information is not intended to replace advice given to you by your health care provider. Make sure you discuss any questions you have with your health care provider. Document Released: 07/22/2015 Document Revised: 03/14/2016 Document Reviewed: 04/26/2015 Elsevier Interactive Patient Education  2018 [employer] Inc[employer]  

## 2017-10-31 NOTE — Progress Notes (Signed)
Subjective:     [name] is a 57 y.o. female and is here for a comprehensive physical exam. The patient reports no problems.  Social History   Socioeconomic History  . Marital status: Married    Spouse name: Not on file  . Number of children: 1  . Years of education: Not on file  . Highest education level: Not on file  Occupational History  . Occupation: Nurse, learning disability: [place]  . Financial resource strain: Not on file  . Food insecurity:    Worry: Not on file    Inability: Not on file  . Transportation needs:    Medical: Not on file    Non-medical: Not on file  Tobacco Use  . Smoking status: Never Smoker  . Smokeless tobacco: Never Used  Substance and Sexual Activity  . Alcohol use: No  . Drug use: No  . Sexual activity: Yes    Partners: Male  Lifestyle  . Physical activity:    Days per week: Not on file    Minutes per session: Not on file  . Stress: Not on file  Relationships  . Social connections:    Talks on phone: Not on file    Gets together: Not on file    Attends religious service: Not on file    Active member of club or organization: Not on file    Attends meetings of clubs or organizations: Not on file    Relationship status: Not on file  . Intimate partner violence:    Fear of current or ex partner: Not on file    Emotionally abused: Not on file    Physically abused: Not on file    Forced sexual activity: Not on file  Other Topics Concern  . Not on file  Social History Narrative   Exercise--walk, daily   Health Maintenance  Topic Date Due  . Samul Dada  03/20/2017  . INFLUENZA VACCINE  02/06/2018  . MAMMOGRAM  04/18/2019  . PAP SMEAR  10/30/2019  . COLONOSCOPY  09/26/2022  . Hepatitis C Screening  Completed  . HIV Screening  Completed    The following portions of the patient's history were reviewed and updated as appropriate:  She  has a past medical history of Allergy, Anemia,  Anxiety, Asthma, Diverticulosis, GERD (gastroesophageal reflux disease), Gestational HTN, Heart murmur, Hyperlipidemia, and Melanoma ([place]). She does not have any pertinent problems on file. She  has a past surgical history that includes moles removed; nasal polyps; Cholecystectomy; and Colonoscopy. Her family history includes Cancer in her maternal uncle and maternal uncle; Diabetes in her maternal grandfather; Heart disease (age of onset: 63) in her maternal grandfather; Hyperlipidemia in her maternal grandfather, maternal grandmother, and mother; Hypertension in her mother; Stroke in her maternal grandmother; Stroke (age of onset: 28) in her mother; Thyroid disease in her mother. She  reports that she has never smoked. She has never used smokeless tobacco. She reports that she does not drink alcohol or use drugs. She has a current medication list which includes the following prescription(s): azelastine, ibuprofen, acetaminophen, loratadine, lorazepam, omeprazole, ondansetron, sumatriptan, and sumatriptan. Current Outpatient Medications on File Prior to Visit  Medication Sig Dispense Refill  . azelastine (ASTELIN) 0.1 % nasal spray Place 2 sprays into both nostrils 2 (two) times daily. 30 mL 6  . ibuprofen (ADVIL,MOTRIN) 200 MG tablet Take 200 mg by mouth every 6 (six) hours as needed for pain.    Marland Kitchen  Ibuprofen (MIDOL PO) Take 1 capsule by mouth 4 (four) times daily as needed.    . loratadine (CLARITIN) 10 MG tablet Take 10 mg by mouth daily.    Marland Kitchen LORazepam (ATIVAN) 1 MG tablet Take 1 tablet (1 mg total) by mouth daily as needed. 90 tablet 0  . omeprazole (PRILOSEC) 40 MG capsule TAKE 1 CAPSULE DAILY 90 capsule 3  . ondansetron (ZOFRAN-ODT) 4 MG disintegrating tablet Take 1-2 tablets (4-8 mg total) by mouth 3 (three) times daily as needed for nausea or vomiting. 20 tablet 0  . SUMAtriptan (IMITREX) 100 MG tablet USE AS DIRECTED 36 tablet 1   No current facility-administered medications on file prior  to visit.    She has No Known Allergies..  Review of Systems Review of Systems  Constitutional: Negative for activity change, appetite change and fatigue.  HENT: Negative for hearing loss, congestion, tinnitus and ear discharge.  dentist q52m Eyes: Negative for visual disturbance (see optho q1y -- vision corrected to 20/20 with glasses).  Respiratory: Negative for cough, chest tightness and shortness of breath.   Cardiovascular: Negative for chest pain, palpitations and leg swelling.  Gastrointestinal: Negative for abdominal pain, diarrhea, constipation and abdominal distention.  Genitourinary: Negative for urgency, frequency, decreased urine volume and difficulty urinating.  Musculoskeletal: Negative for back pain, arthralgias and gait problem.  Skin: Negative for color change, pallor and rash.  Neurological: Negative for dizziness, light-headedness, numbness and headaches.  Hematological: Negative for adenopathy. Does not bruise/bleed easily.  Psychiatric/Behavioral: Negative for suicidal ideas, confusion, sleep disturbance, self-injury, dysphoric mood, decreased concentration and agitation.      Objective:    BP 128/68 (BP Location: Left Arm, Patient Position: Sitting, Cuff Size: Normal)   Pulse 70   Resp 16   Ht 5' 0.2" (1.529 m)   Wt 124 lb 3.2 oz (56.3 kg)   SpO2 95%   BMI 24.10 kg/m  General appearance: alert, cooperative, appears stated age and no distress Head: Normocephalic, without obvious abnormality, atraumatic Eyes: conjunctivae/corneas clear. PERRL, EOM's intact. Fundi benign. Ears: normal TM's and external ear canals both ears Nose: Nares normal. Septum midline. Mucosa normal. No drainage or sinus tenderness. Throat: lips, mucosa, and tongue normal; teeth and gums normal Neck: no adenopathy, no carotid bruit, no JVD, supple, symmetrical, trachea midline and thyroid not enlarged, symmetric, no tenderness/mass/nodules Back: symmetric, no curvature. ROM normal. No  CVA tenderness. Lungs: clear to auscultation bilaterally Breasts: normal appearance, no masses or tenderness Heart: regular rate and rhythm, S1, S2 normal, no murmur, click, rub or gallop Abdomen: soft, non-tender; bowel sounds normal; no masses,  no organomegaly Pelvic: deferred Extremities: extremities normal, atraumatic, no cyanosis or edema Pulses: 2+ and symmetric Skin: Skin color, texture, turgor normal. No rashes or lesions Lymph nodes: Cervical, supraclavicular, and axillary nodes normal. Neurologic: Alert and oriented X 3, normal strength and tone. Normal symmetric reflexes. Normal coordination and gait    Assessment:    Healthy female exam.      Plan:    ghm utd Check labs See After Visit Summary for Counseling Recommendations

## 2017-11-01 LAB — COMPREHENSIVE METABOLIC PANEL
ALBUMIN: 4.3 g/dL (ref 3.5–5.2)
ALK PHOS: 72 U/L (ref 39–117)
ALT: 17 U/L (ref 0–35)
AST: 20 U/L (ref 0–37)
BUN: 17 mg/dL (ref 6–23)
CALCIUM: 9.3 mg/dL (ref 8.4–10.5)
CO2: 27 mEq/L (ref 19–32)
Chloride: 104 mEq/L (ref 96–112)
Creatinine, Ser: 0.9 mg/dL (ref 0.40–1.20)
GFR: 68.61 mL/min (ref >60.00)
Glucose, Bld: 79 mg/dL (ref 70–99)
POTASSIUM: 4.1 meq/L (ref 3.5–5.1)
SODIUM: 140 meq/L (ref 135–145)
TOTAL PROTEIN: 7.2 g/dL (ref 6.0–8.3)
Total Bilirubin: 0.4 mg/dL (ref 0.2–1.2)

## 2017-11-01 LAB — LIPID PANEL
CHOLESTEROL: 207 mg/dL — AB (ref 0–200)
HDL: 66.2 mg/dL (ref >39.00)
LDL Cholesterol: 131 mg/dL — ABNORMAL HIGH (ref 0–99)
NonHDL: 141.05
Total CHOL/HDL Ratio: 3
Triglycerides: 50 mg/dL (ref 0.0–149.0)
VLDL: 10 mg/dL (ref 0.0–40.0)

## 2017-11-01 LAB — CBC WITH DIFFERENTIAL/PLATELET
Basophils Absolute: 0 10*3/uL (ref 0.0–0.1)
Basophils Relative: 0.8 % (ref 0.0–3.0)
EOS PCT: 4.3 % (ref 0.0–5.0)
Eosinophils Absolute: 0.3 10*3/uL (ref 0.0–0.7)
HCT: 37.8 % (ref 36.0–46.0)
HEMOGLOBIN: 12.7 g/dL (ref 12.0–15.0)
Lymphocytes Relative: 34.6 % (ref 12.0–46.0)
Lymphs Abs: 2.2 10*3/uL (ref 0.7–4.0)
MCHC: 33.6 g/dL (ref 30.0–36.0)
MCV: 92.8 fl (ref 78.0–100.0)
MONO ABS: 0.4 10*3/uL (ref 0.1–1.0)
Monocytes Relative: 6.8 % (ref 3.0–12.0)
Neutro Abs: 3.4 10*3/uL (ref 1.4–7.7)
Neutrophils Relative %: 53.5 % (ref 43.0–77.0)
Platelets: 301 10*3/uL (ref 150.0–400.0)
RBC: 4.07 Mil/uL (ref 3.87–5.11)
RDW: 13.2 % (ref 11.5–15.5)
WBC: 6.4 10*3/uL (ref 4.0–10.5)

## 2017-11-01 LAB — TSH: TSH: 1.67 u[IU]/mL (ref 0.35–4.50)

## 2018-03-21 ENCOUNTER — Other Ambulatory Visit: Payer: Self-pay | Admitting: Family Medicine

## 2018-03-21 DIAGNOSIS — K219 Gastro-esophageal reflux disease without esophagitis: Secondary | ICD-10-CM

## 2018-04-03 ENCOUNTER — Other Ambulatory Visit: Payer: Self-pay | Admitting: Family Medicine

## 2018-04-03 DIAGNOSIS — G43809 Other migraine, not intractable, without status migrainosus: Secondary | ICD-10-CM

## 2018-07-04 ENCOUNTER — Other Ambulatory Visit: Payer: Self-pay | Admitting: Family Medicine

## 2018-07-04 DIAGNOSIS — Z1231 Encounter for screening mammogram for malignant neoplasm of breast: Secondary | ICD-10-CM

## 2018-07-08 ENCOUNTER — Ambulatory Visit: Payer: BC Managed Care – PPO

## 2018-07-30 ENCOUNTER — Ambulatory Visit
Admission: RE | Admit: 2018-07-30 | Discharge: 2018-07-30 | Disposition: A | Discharge status: Home / Self Care | Payer: BC Managed Care – PPO | Source: Ambulatory Visit | Attending: Family Medicine | Admitting: Family Medicine

## 2018-07-30 DIAGNOSIS — Z1231 Encounter for screening mammogram for malignant neoplasm of breast: Secondary | ICD-10-CM

## 2018-10-15 ENCOUNTER — Other Ambulatory Visit: Payer: Self-pay | Admitting: Family Medicine

## 2018-10-15 DIAGNOSIS — G43809 Other migraine, not intractable, without status migrainosus: Secondary | ICD-10-CM

## 2018-11-03 ENCOUNTER — Encounter: Payer: BC Managed Care – PPO | Admitting: Family Medicine

## 2019-01-13 ENCOUNTER — Other Ambulatory Visit: Payer: Self-pay | Admitting: Family Medicine

## 2019-01-13 DIAGNOSIS — G43809 Other migraine, not intractable, without status migrainosus: Secondary | ICD-10-CM

## 2019-01-22 ENCOUNTER — Other Ambulatory Visit: Payer: Self-pay

## 2019-01-22 ENCOUNTER — Encounter: Payer: Self-pay | Admitting: Family Medicine

## 2019-01-22 ENCOUNTER — Ambulatory Visit (INDEPENDENT_AMBULATORY_CARE_PROVIDER_SITE_OTHER): Payer: BC Managed Care – PPO | Admitting: Family Medicine

## 2019-01-22 VITALS — BP 126/74 | HR 64 | Temp 98.1°F | Resp 18 | Ht 60.2 in | Wt 120.0 lb

## 2019-01-22 DIAGNOSIS — G43809 Other migraine, not intractable, without status migrainosus: Secondary | ICD-10-CM

## 2019-01-22 DIAGNOSIS — F419 Anxiety disorder, unspecified: Secondary | ICD-10-CM

## 2019-01-22 DIAGNOSIS — Z0000 Encounter for general adult medical examination without abnormal findings: Secondary | ICD-10-CM | POA: Diagnosis not present

## 2019-01-22 MED ORDER — SUMATRIPTAN SUCCINATE 100 MG PO TABS: 100.0000 mg | ORAL_TABLET | Freq: Once | ORAL | 0 refills | Status: DC

## 2019-01-22 MED ORDER — LORAZEPAM 1 MG PO TABS: 1.0000 mg | ORAL_TABLET | Freq: Every day | ORAL | 0 refills | Status: DC | PRN

## 2019-01-22 NOTE — Progress Notes (Signed)
Subjective:     [name] is a 58 y.o. female and is here for a comprehensive physical exam. The patient reports no problems.  Social History   Socioeconomic History  . Marital status: Married    Spouse name: Not on file  . Number of children: 1  . Years of education: Not on file  . Highest education level: Not on file  Occupational History  . Occupation: Nurse, learning disability: [place]  . Financial resource strain: Not on file  . Food insecurity    Worry: Not on file    Inability: Not on file  . Transportation needs    Medical: Not on file    Non-medical: Not on file  Tobacco Use  . Smoking status: Never Smoker  . Smokeless tobacco: Never Used  Substance and Sexual Activity  . Alcohol use: No  . Drug use: No  . Sexual activity: Yes    Partners: Male  Lifestyle  . Physical activity    Days per week: Not on file    Minutes per session: Not on file  . Stress: Not on file  Relationships  . Social Herbalist on phone: Not on file    Gets together: Not on file    Attends religious service: Not on file    Active member of club or organization: Not on file    Attends meetings of clubs or organizations: Not on file    Relationship status: Not on file  . Intimate partner violence    Fear of current or ex partner: Not on file    Emotionally abused: Not on file    Physically abused: Not on file    Forced sexual activity: Not on file  Other Topics Concern  . Not on file  Social History Narrative   Exercise--walk, daily   Health Maintenance  Topic Date Due  . INFLUENZA VACCINE  02/07/2019  . PAP SMEAR-Modifier  10/30/2019  . MAMMOGRAM  07/30/2020  . COLONOSCOPY  09/26/2022  . TETANUS/TDAP  11/01/2027  . Hepatitis C Screening  Completed  . HIV Screening  Completed    The following portions of the patient's history were reviewed and updated as appropriate: She  has a past medical history of Allergy, Anemia,  Anxiety, Asthma, Diverticulosis, GERD (gastroesophageal reflux disease), Gestational HTN, Heart murmur, Hyperlipidemia, and Melanoma ([place]). She does not have any pertinent problems on file. She  has a past surgical history that includes moles removed; nasal polyps; Cholecystectomy; and Colonoscopy. Her family history includes Cancer in her maternal uncle and maternal uncle; Diabetes in her maternal grandfather; Heart disease (age of onset: 58) in her maternal grandfather; Hyperlipidemia in her maternal grandfather, maternal grandmother, and mother; Hypertension in her mother; Stroke in her maternal grandmother; Stroke (age of onset: 62) in her mother; Thyroid disease in her mother. She  reports that she has never smoked. She has never used smokeless tobacco. She reports that she does not drink alcohol or use drugs. She has a current medication list which includes the following prescription(s): azelastine, ibuprofen, acetaminophen, loratadine, lorazepam, omeprazole, ondansetron, and sumatriptan. Current Outpatient Medications on File Prior to Visit  Medication Sig Dispense Refill  . azelastine (ASTELIN) 0.1 % nasal spray Place 2 sprays into both nostrils 2 (two) times daily. 30 mL 6  . ibuprofen (ADVIL,MOTRIN) 200 MG tablet Take 200 mg by mouth every 6 (six) hours as needed for pain.    . Ibuprofen (  MIDOL PO) Take 1 capsule by mouth 4 (four) times daily as needed.    . loratadine (CLARITIN) 10 MG tablet Take 10 mg by mouth daily.    Marland Kitchen omeprazole (PRILOSEC) 40 MG capsule TAKE 1 CAPSULE DAILY 90 capsule 3  . ondansetron (ZOFRAN-ODT) 4 MG disintegrating tablet Take 1-2 tablets (4-8 mg total) by mouth 3 (three) times daily as needed for nausea or vomiting. 20 tablet 0   No current facility-administered medications on file prior to visit.    She has No Known Allergies..  Review of System Review of Systems  Constitutional: Negative for activity change, appetite change and fatigue.  HENT: Negative for  hearing loss, congestion, tinnitus and ear discharge.  dentist q64m Eyes: Negative for visual disturbance (see optho q1y -- vision corrected to 20/20 with glasses).  Respiratory: Negative for cough, chest tightness and shortness of breath.   Cardiovascular: Negative for chest pain, palpitations and leg swelling.  Gastrointestinal: Negative for abdominal pain, diarrhea, constipation and abdominal distention.  Genitourinary: Negative for urgency, frequency, decreased urine volume and difficulty urinating.  Musculoskeletal: Negative for back pain, arthralgias and gait problem.  Skin: Negative for color change, pallor and rash.  Neurological: Negative for dizziness, light-headedness, numbness and headaches.  Hematological: Negative for adenopathy. Does not bruise/bleed easily.  Psychiatric/Behavioral: Negative for suicidal ideas, confusion, sleep disturbance, self-injury, dysphoric mood, decreased concentration and agitation.       Objective:    BP 126/74 (BP Location: Left Arm, Patient Position: Sitting, Cuff Size: Normal)   Pulse 64   Temp 98.1 F (36.7 C) (Oral)   Resp 18   Ht 5' 0.2" (1.529 m)   Wt 120 lb (54.4 kg)   SpO2 100%   BMI 23.28 kg/m  General appearance: alert, cooperative, appears stated age and no distress Head: Normocephalic, without obvious abnormality, atraumatic Eyes: negative findings: lids and lashes normal, conjunctivae and sclerae normal and pupils equal, round, reactive to light and accomodation Ears: normal TM's and external ear canals both ears Nose: Nares normal. Septum midline. Mucosa normal. No drainage or sinus tenderness. Throat: lips, mucosa, and tongue normal; teeth and gums normal Neck: no adenopathy, no carotid bruit, no JVD, supple, symmetrical, trachea midline and thyroid not enlarged, symmetric, no tenderness/mass/nodules Back: symmetric, no curvature. ROM normal. No CVA tenderness. Lungs: clear to auscultation bilaterally Breasts: normal  appearance, no masses or tenderness Heart: regular rate and rhythm, S1, S2 normal, no murmur, click, rub or gallop Abdomen: soft, non-tender; bowel sounds normal; no masses,  no organomegaly Pelvic: deferred Extremities: extremities normal, atraumatic, no cyanosis or edema Pulses: 2+ and symmetric Skin: Skin color, texture, turgor normal. No rashes or lesions Lymph nodes: Cervical, supraclavicular, and axillary nodes normal. Neurologic: Alert and oriented X 3, normal strength and tone. Normal symmetric reflexes. Normal coordination and gait    Assessment:    Healthy female exam.      Plan:    ghm utd Check labs  See After Visit Summary for Counseling Recommendations    1. Other migraine without status migrainosus, not intractable Stable  con't meds  - SUMAtriptan (IMITREX) 100 MG tablet; Take 1 tablet (100 mg total) by mouth once for 1 dose. May repeat in 2 hours if headache persists or recurs.  Dispense: 36 tablet; Refill: 0  2. Anxiety Stable refill meds  No problems with meds  - LORazepam (ATIVAN) 1 MG tablet; Take 1 tablet (1 mg total) by mouth daily as needed.  Dispense: 90 tablet; Refill: 0  3. Preventative  health care ghm utd check labs  See AVS - Lipid panel - CBC with Differential/Platelet - TSH - Comprehensive metabolic panel

## 2019-01-22 NOTE — Patient Instructions (Signed)

## 2019-01-23 LAB — CBC WITH DIFFERENTIAL/PLATELET
Basophils Absolute: 0.1 10*3/uL (ref 0.0–0.1)
Basophils Relative: 1.3 % (ref 0.0–3.0)
Eosinophils Absolute: 0.4 10*3/uL (ref 0.0–0.7)
Eosinophils Relative: 7.2 % — ABNORMAL HIGH (ref 0.0–5.0)
HCT: 37.1 % (ref 36.0–46.0)
Hemoglobin: 12.5 g/dL (ref 12.0–15.0)
Lymphocytes Relative: 37.5 % (ref 12.0–46.0)
Lymphs Abs: 2.1 10*3/uL (ref 0.7–4.0)
MCHC: 33.8 g/dL (ref 30.0–36.0)
MCV: 95.2 fl (ref 78.0–100.0)
Monocytes Absolute: 0.4 10*3/uL (ref 0.1–1.0)
Monocytes Relative: 6.6 % (ref 3.0–12.0)
Neutro Abs: 2.6 10*3/uL (ref 1.4–7.7)
Neutrophils Relative %: 47.4 % (ref 43.0–77.0)
Platelets: 291 10*3/uL (ref 150.0–400.0)
RBC: 3.89 Mil/uL (ref 3.87–5.11)
RDW: 13.9 % (ref 11.5–15.5)
WBC: 5.6 10*3/uL (ref 4.0–10.5)

## 2019-01-23 LAB — COMPREHENSIVE METABOLIC PANEL
ALT: 17 U/L (ref 0–35)
AST: 20 U/L (ref 0–37)
Albumin: 4.5 g/dL (ref 3.5–5.2)
Alkaline Phosphatase: 67 U/L (ref 39–117)
BUN: 18 mg/dL (ref 6–23)
CO2: 29 mEq/L (ref 19–32)
Calcium: 9.3 mg/dL (ref 8.4–10.5)
Chloride: 104 mEq/L (ref 96–112)
Creatinine, Ser: 0.88 mg/dL (ref 0.40–1.20)
GFR: 65.96 mL/min (ref >60.00)
Glucose, Bld: 85 mg/dL (ref 70–99)
Potassium: 4.2 mEq/L (ref 3.5–5.1)
Sodium: 140 mEq/L (ref 135–145)
Total Bilirubin: 0.4 mg/dL (ref 0.2–1.2)
Total Protein: 6.7 g/dL (ref 6.0–8.3)

## 2019-01-23 LAB — LIPID PANEL
Cholesterol: 225 mg/dL — ABNORMAL HIGH (ref 0–200)
HDL: 62.1 mg/dL (ref >39.00)
LDL Cholesterol: 151 mg/dL — ABNORMAL HIGH (ref 0–99)
NonHDL: 162.5
Total CHOL/HDL Ratio: 4
Triglycerides: 58 mg/dL (ref 0.0–149.0)
VLDL: 11.6 mg/dL (ref 0.0–40.0)

## 2019-01-23 LAB — TSH: TSH: 1.19 u[IU]/mL (ref 0.35–4.50)

## 2019-01-25 ENCOUNTER — Other Ambulatory Visit: Payer: Self-pay | Admitting: Family Medicine

## 2019-01-25 DIAGNOSIS — E785 Hyperlipidemia, unspecified: Secondary | ICD-10-CM

## 2019-01-29 ENCOUNTER — Other Ambulatory Visit: Payer: Self-pay

## 2019-01-29 ENCOUNTER — Ambulatory Visit: Payer: BC Managed Care – PPO | Admitting: Family Medicine

## 2019-01-29 ENCOUNTER — Encounter: Payer: Self-pay | Admitting: Family Medicine

## 2019-01-29 VITALS — BP 115/73 | HR 69 | Temp 97.4°F | Resp 18 | Ht 60.2 in | Wt 119.8 lb

## 2019-01-29 DIAGNOSIS — H6123 Impacted cerumen, bilateral: Secondary | ICD-10-CM | POA: Diagnosis not present

## 2019-01-29 DIAGNOSIS — E785 Hyperlipidemia, unspecified: Secondary | ICD-10-CM

## 2019-01-29 DIAGNOSIS — H6120 Impacted cerumen, unspecified ear: Secondary | ICD-10-CM | POA: Insufficient documentation

## 2019-01-29 NOTE — Assessment & Plan Note (Signed)
Encouraged heart healthy diet, increase exercise, avoid trans fats, consider a krill oil cap daily 

## 2019-01-29 NOTE — Progress Notes (Signed)
Patient ID: [name], female    DOB: [birth year]  Age: 58 y.o. MRN: [id number]    Subjective:  Subjective  HPI [name] presents for ear irrigation .   Ears feel plugged She would also like to discuss her cholesterol   Review of Systems  Constitutional: Negative for appetite change, diaphoresis, fatigue and unexpected weight change.  HENT: Positive for ear pain. Negative for congestion, ear discharge, postnasal drip, rhinorrhea, sinus pressure, sinus pain, sneezing, sore throat and tinnitus.   Eyes: Negative for pain, redness and visual disturbance.  Respiratory: Negative for cough, chest tightness, shortness of breath and wheezing.   Cardiovascular: Negative for chest pain, palpitations and leg swelling.  Endocrine: Negative for cold intolerance, heat intolerance, polydipsia, polyphagia and polyuria.  Genitourinary: Negative for difficulty urinating, dysuria and frequency.  Neurological: Negative for dizziness, light-headedness, numbness and headaches.    History Past Medical History:  Diagnosis Date  . Allergy   . Anemia    SLIGHT AT LAST PCP OV  . Anxiety   . Asthma    as a child  . Diverticulosis   . GERD (gastroesophageal reflux disease)    OCCASIONAL  . Gestational HTN   . Heart murmur   . Hyperlipidemia   . Melanoma ([place])    back    She has a past surgical history that includes moles removed; nasal polyps; Cholecystectomy; and Colonoscopy.   Her family history includes Cancer in her maternal uncle and maternal uncle; Diabetes in her maternal grandfather; Heart disease (age of onset: 6) in her maternal grandfather; Hyperlipidemia in her maternal grandfather, maternal grandmother, and mother; Hypertension in her mother; Stroke in her maternal grandmother; Stroke (age of onset: 54) in her mother; Thyroid disease in her mother.She reports that she has never smoked. She has never used smokeless tobacco. She reports that she does not drink alcohol or use  drugs.  Current Outpatient Medications on File Prior to Visit  Medication Sig Dispense Refill  . azelastine (ASTELIN) 0.1 % nasal spray Place 2 sprays into both nostrils 2 (two) times daily. 30 mL 6  . ibuprofen (ADVIL,MOTRIN) 200 MG tablet Take 200 mg by mouth every 6 (six) hours as needed for pain.    . Ibuprofen (MIDOL PO) Take 1 capsule by mouth 4 (four) times daily as needed.    . loratadine (CLARITIN) 10 MG tablet Take 10 mg by mouth daily.    Marland Kitchen LORazepam (ATIVAN) 1 MG tablet Take 1 tablet (1 mg total) by mouth daily as needed. 90 tablet 0  . omeprazole (PRILOSEC) 40 MG capsule TAKE 1 CAPSULE DAILY 90 capsule 3  . ondansetron (ZOFRAN-ODT) 4 MG disintegrating tablet Take 1-2 tablets (4-8 mg total) by mouth 3 (three) times daily as needed for nausea or vomiting. 20 tablet 0  . SUMAtriptan (IMITREX) 100 MG tablet Take 1 tablet (100 mg total) by mouth once for 1 dose. May repeat in 2 hours if headache persists or recurs. 36 tablet 0   No current facility-administered medications on file prior to visit.      Objective:  Objective  Physical Exam Vitals signs and nursing note reviewed.  Constitutional:      Appearance: She is well-developed.  HENT:     Head: Normocephalic and atraumatic.     Right Ear: There is impacted cerumen.     Left Ear: There is impacted cerumen.     Ears:   Eyes:     Conjunctiva/sclera: Conjunctivae normal.  Neck:  Musculoskeletal: Normal range of motion and neck supple.     Thyroid: No thyromegaly.     Vascular: No carotid bruit or JVD.  Cardiovascular:     Rate and Rhythm: Normal rate and regular rhythm.     Heart sounds: Normal heart sounds. No murmur.  Pulmonary:     Effort: Pulmonary effort is normal. No respiratory distress.     Breath sounds: Normal breath sounds. No wheezing or rales.  Chest:     Chest wall: No tenderness.  Neurological:     Mental Status: She is alert and oriented to person, place, and time.    BP 115/73 (BP Location:  Left Arm, Patient Position: Sitting, Cuff Size: Normal)   Pulse 69   Temp (!) 97.4 F (36.3 C) (Oral)   Resp 18   Ht 5' 0.2" (1.529 m)   Wt 119 lb 12.8 oz (54.3 kg)   SpO2 100%   BMI 23.24 kg/m  Wt Readings from Last 3 Encounters:  01/29/19 119 lb 12.8 oz (54.3 kg)  01/22/19 120 lb (54.4 kg)  10/31/17 124 lb 3.2 oz (56.3 kg)     Lab Results  Component Value Date   WBC 5.6 01/22/2019   HGB 12.5 01/22/2019   HCT 37.1 01/22/2019   PLT 291.0 01/22/2019   GLUCOSE 85 01/22/2019   CHOL 225 (H) 01/22/2019   TRIG 58.0 01/22/2019   HDL 62.10 01/22/2019   LDLDIRECT 169.7 05/20/2009   LDLCALC 151 (H) 01/22/2019   ALT 17 01/22/2019   AST 20 01/22/2019   NA 140 01/22/2019   K 4.2 01/22/2019   CL 104 01/22/2019   CREATININE 0.88 01/22/2019   BUN 18 01/22/2019   CO2 29 01/22/2019   TSH 1.19 01/22/2019   HGBA1C 5.7 03/24/2008    Mm 3d Screen Breast Bilateral  Result Date: 07/31/2018 CLINICAL DATA:  Screening. EXAM: DIGITAL SCREENING BILATERAL MAMMOGRAM WITH TOMO AND CAD COMPARISON:  Previous exam(s). ACR Breast Density Category b: There are scattered areas of fibroglandular density. FINDINGS: There are no findings suspicious for malignancy. Images were processed with CAD. IMPRESSION: No mammographic evidence of malignancy. A result letter of this screening mammogram will be mailed directly to the patient. RECOMMENDATION: Screening mammogram in one year. (Code:SM-B-01Y) BI-RADS CATEGORY  1: Negative. Electronically Signed   By: Franki Cabot M.D.   On: 07/31/2018 09:52     Assessment & Plan:  Plan  I am having [name] maintain her loratadine, ibuprofen, Ibuprofen (MIDOL PO), ondansetron, azelastine, omeprazole, SUMAtriptan, and LORazepam.  No orders of the defined types were placed in this encounter.   Problem List Items Addressed This Visit      Unprioritized   Cerumen impaction    Irrigated with no complication Unable to use hoop Use debrox prn        Hyperlipidemia - Primary    Encouraged heart healthy diet, increase exercise, avoid trans fats, consider a krill oil cap daily      Relevant Orders   Lipid panel   Comprehensive metabolic panel      Follow-up: Return in about 3 months (around 05/01/2019), or if symptoms worsen or fail to improve, for lab app only in 3 months-- order is in        cpe 1 year.  Ann Held, DO

## 2019-01-29 NOTE — Assessment & Plan Note (Signed)
Irrigated with no complication Unable to use hoop Use debrox prn

## 2019-01-29 NOTE — Patient Instructions (Signed)
Earwax Buildup, Adult The ears produce a substance called earwax that helps keep bacteria out of the ear and protects the skin in the ear canal. Occasionally, earwax can build up in the ear and cause discomfort or hearing loss. What increases the risk? This condition is more likely to develop in people who:  Are female.  Are elderly.  Naturally produce more earwax.  Clean their ears often with cotton swabs.  Use earplugs often.  Use in-ear headphones often.  Wear hearing aids.  Have narrow ear canals.  Have earwax that is overly thick or sticky.  Have eczema.  Are dehydrated.  Have excess hair in the ear canal. What are the signs or symptoms? Symptoms of this condition include:  Reduced or muffled hearing.  A feeling of fullness in the ear or feeling that the ear is plugged.  Fluid coming from the ear.  Ear pain.  Ear itch.  Ringing in the ear.  Coughing.  An obvious piece of earwax that can be seen inside the ear canal. How is this diagnosed? This condition may be diagnosed based on:  Your symptoms.  Your medical history.  An ear exam. During the exam, your health care provider will look into your ear with an instrument called an otoscope. You may have tests, including a hearing test. How is this treated? This condition may be treated by:  Using ear drops to soften the earwax.  Having the earwax removed by a health care provider. The health care provider may: ? Flush the ear with water. ? Use an instrument that has a loop on the end (curette). ? Use a suction device.  Surgery to remove the wax buildup. This may be done in severe cases. Follow these instructions at home:   Take over-the-counter and prescription medicines only as told by your health care provider.  Do not put any objects, including cotton swabs, into your ear. You can clean the opening of your ear canal with a washcloth or facial tissue.  Follow instructions from your health care  provider about cleaning your ears. Do not over-clean your ears.  Drink enough fluid to keep your urine clear or pale yellow. This will help to thin the earwax.  Keep all follow-up visits as told by your health care provider. If earwax builds up in your ears often or if you use hearing aids, consider seeing your health care provider for routine, preventive ear cleanings. Ask your health care provider how often you should schedule your cleanings.  If you have hearing aids, clean them according to instructions from the manufacturer and your health care provider. Contact a health care provider if:  You have ear pain.  You develop a fever.  You have blood, pus, or other fluid coming from your ear.  You have hearing loss.  You have ringing in your ears that does not go away.  Your symptoms do not improve with treatment.  You feel like the room is spinning (vertigo). Summary  Earwax can build up in the ear and cause discomfort or hearing loss.  The most common symptoms of this condition include reduced or muffled hearing and a feeling of fullness in the ear or feeling that the ear is plugged.  This condition may be diagnosed based on your symptoms, your medical history, and an ear exam.  This condition may be treated by using ear drops to soften the earwax or by having the earwax removed by a health care provider.  Do not put any   objects, including cotton swabs, into your ear. You can clean the opening of your ear canal with a washcloth or facial tissue. This information is not intended to replace advice given to you by your health care provider. Make sure you discuss any questions you have with your health care provider. Document Released: 08/02/2004 Document Revised: 06/07/2017 Document Reviewed: 09/05/2016 [employer]vier Patient Education  2020 [employer]  

## 2019-04-01 ENCOUNTER — Ambulatory Visit (INDEPENDENT_AMBULATORY_CARE_PROVIDER_SITE_OTHER): Payer: BC Managed Care – PPO

## 2019-04-01 ENCOUNTER — Other Ambulatory Visit: Payer: Self-pay

## 2019-04-01 DIAGNOSIS — Z23 Encounter for immunization: Secondary | ICD-10-CM

## 2019-04-06 ENCOUNTER — Other Ambulatory Visit: Payer: Self-pay | Admitting: Family Medicine

## 2019-04-06 DIAGNOSIS — G43809 Other migraine, not intractable, without status migrainosus: Secondary | ICD-10-CM

## 2019-04-14 ENCOUNTER — Encounter: Payer: Self-pay | Admitting: Family Medicine

## 2019-04-15 MED ORDER — AZELASTINE HCL 0.1 % NA SOLN: 2.0000 | Freq: Two times a day (BID) | NASAL | 6 refills | Status: DC

## 2019-05-01 ENCOUNTER — Other Ambulatory Visit: Payer: BC Managed Care – PPO

## 2019-07-21 ENCOUNTER — Other Ambulatory Visit: Payer: BC Managed Care – PPO

## 2019-07-29 ENCOUNTER — Other Ambulatory Visit: Payer: BC Managed Care – PPO

## 2019-10-02 ENCOUNTER — Other Ambulatory Visit: Payer: Self-pay | Admitting: Family Medicine

## 2019-10-02 DIAGNOSIS — G43809 Other migraine, not intractable, without status migrainosus: Secondary | ICD-10-CM

## 2019-12-31 ENCOUNTER — Other Ambulatory Visit: Payer: Self-pay | Admitting: Family Medicine

## 2019-12-31 DIAGNOSIS — G43809 Other migraine, not intractable, without status migrainosus: Secondary | ICD-10-CM

## 2020-01-21 ENCOUNTER — Ambulatory Visit
Admission: RE | Admit: 2020-01-21 | Discharge: 2020-01-21 | Disposition: A | Discharge status: Home / Self Care | Payer: BC Managed Care – PPO | Source: Ambulatory Visit | Attending: Family Medicine | Admitting: Family Medicine

## 2020-01-21 ENCOUNTER — Other Ambulatory Visit: Payer: Self-pay | Admitting: Family Medicine

## 2020-01-21 ENCOUNTER — Other Ambulatory Visit: Payer: Self-pay

## 2020-01-21 DIAGNOSIS — Z1231 Encounter for screening mammogram for malignant neoplasm of breast: Secondary | ICD-10-CM

## 2020-01-26 ENCOUNTER — Other Ambulatory Visit: Payer: Self-pay

## 2020-01-26 ENCOUNTER — Encounter: Payer: Self-pay | Admitting: Family Medicine

## 2020-01-26 ENCOUNTER — Other Ambulatory Visit (HOSPITAL_COMMUNITY)
Admission: RE | Admit: 2020-01-26 | Discharge: 2020-01-26 | Disposition: A | Discharge status: Home / Self Care | Payer: BC Managed Care – PPO | Source: Ambulatory Visit | Attending: Family Medicine | Admitting: Family Medicine

## 2020-01-26 ENCOUNTER — Ambulatory Visit (INDEPENDENT_AMBULATORY_CARE_PROVIDER_SITE_OTHER): Payer: BC Managed Care – PPO | Admitting: Family Medicine

## 2020-01-26 VITALS — BP 132/76 | HR 66 | Temp 97.8°F | Resp 18 | Ht 60.2 in | Wt 116.2 lb

## 2020-01-26 DIAGNOSIS — G43809 Other migraine, not intractable, without status migrainosus: Secondary | ICD-10-CM

## 2020-01-26 DIAGNOSIS — E2839 Other primary ovarian failure: Secondary | ICD-10-CM

## 2020-01-26 DIAGNOSIS — Z0000 Encounter for general adult medical examination without abnormal findings: Secondary | ICD-10-CM

## 2020-01-26 LAB — COMPREHENSIVE METABOLIC PANEL
ALT: 16 U/L (ref 0–35)
AST: 17 U/L (ref 0–37)
Albumin: 4.3 g/dL (ref 3.5–5.2)
Alkaline Phosphatase: 60 U/L (ref 39–117)
BUN: 22 mg/dL (ref 6–23)
CO2: 29 mEq/L (ref 19–32)
Calcium: 9.4 mg/dL (ref 8.4–10.5)
Chloride: 104 mEq/L (ref 96–112)
Creatinine, Ser: 0.82 mg/dL (ref 0.40–1.20)
GFR: 71.31 mL/min (ref >60.00)
Glucose, Bld: 85 mg/dL (ref 70–99)
Potassium: 4.1 mEq/L (ref 3.5–5.1)
Sodium: 139 mEq/L (ref 135–145)
Total Bilirubin: 0.4 mg/dL (ref 0.2–1.2)
Total Protein: 6.6 g/dL (ref 6.0–8.3)

## 2020-01-26 LAB — CBC WITH DIFFERENTIAL/PLATELET
Basophils Absolute: 0 10*3/uL (ref 0.0–0.1)
Basophils Relative: 0.8 % (ref 0.0–3.0)
Eosinophils Absolute: 0.3 10*3/uL (ref 0.0–0.7)
Eosinophils Relative: 6.3 % — ABNORMAL HIGH (ref 0.0–5.0)
HCT: 36.9 % (ref 36.0–46.0)
Hemoglobin: 12.4 g/dL (ref 12.0–15.0)
Lymphocytes Relative: 37.5 % (ref 12.0–46.0)
Lymphs Abs: 1.9 10*3/uL (ref 0.7–4.0)
MCHC: 33.6 g/dL (ref 30.0–36.0)
MCV: 93.8 fl (ref 78.0–100.0)
Monocytes Absolute: 0.4 10*3/uL (ref 0.1–1.0)
Monocytes Relative: 7.5 % (ref 3.0–12.0)
Neutro Abs: 2.5 10*3/uL (ref 1.4–7.7)
Neutrophils Relative %: 47.9 % (ref 43.0–77.0)
Platelets: 289 10*3/uL (ref 150.0–400.0)
RBC: 3.93 Mil/uL (ref 3.87–5.11)
RDW: 13.9 % (ref 11.5–15.5)
WBC: 5.2 10*3/uL (ref 4.0–10.5)

## 2020-01-26 LAB — LIPID PANEL
Cholesterol: 212 mg/dL — ABNORMAL HIGH (ref 0–200)
HDL: 61.1 mg/dL (ref >39.00)
LDL Cholesterol: 140 mg/dL — ABNORMAL HIGH (ref 0–99)
NonHDL: 151.13
Total CHOL/HDL Ratio: 3
Triglycerides: 56 mg/dL (ref 0.0–149.0)
VLDL: 11.2 mg/dL (ref 0.0–40.0)

## 2020-01-26 LAB — TSH: TSH: 1.42 u[IU]/mL (ref 0.35–4.50)

## 2020-01-26 MED ORDER — SUMATRIPTAN SUCCINATE 100 MG PO TABS: ORAL_TABLET | ORAL | 1 refills | Status: DC

## 2020-01-26 NOTE — Patient Instructions (Signed)

## 2020-01-26 NOTE — Progress Notes (Signed)
Subjective:     [name] is a 59 y.o. female and is here for a comprehensive physical exam. The patient reports no problems.  Social History   Socioeconomic History  . Marital status: Married    Spouse name: Not on file  . Number of children: 1  . Years of education: Not on file  . Highest education level: Not on file  Occupational History  . Occupation: Nurse, learning disability: [place]  Tobacco Use  . Smoking status: Never Smoker  . Smokeless tobacco: Never Used  Substance and Sexual Activity  . Alcohol use: No  . Drug use: No  . Sexual activity: Yes    Partners: Male  Other Topics Concern  . Not on file  Social History Narrative   Exercise--walk, daily   Social Determinants of Health   Financial Resource Strain:   . Difficulty of Paying Living Expenses:   Food Insecurity:   . Worried About Charity fundraiser in the Last Year:   . Arboriculturist in the Last Year:   Transportation Needs:   . Film/video editor (Medical):   Marland Kitchen Lack of Transportation (Non-Medical):   Physical Activity:   . Days of Exercise per Week:   . Minutes of Exercise per Session:   Stress:   . Feeling of Stress :   Social Connections:   . Frequency of Communication with Friends and Family:   . Frequency of Social Gatherings with Friends and Family:   . Attends Religious Services:   . Active Member of Clubs or Organizations:   . Attends Archivist Meetings:   Marland Kitchen Marital Status:   Intimate Partner Violence:   . Fear of Current or Ex-Partner:   . Emotionally Abused:   Marland Kitchen Physically Abused:   . Sexually Abused:    Health Maintenance  Topic Date Due  . PAP SMEAR-Modifier  10/30/2019  . INFLUENZA VACCINE  02/07/2020  . MAMMOGRAM  01/20/2022  . COLONOSCOPY  09/26/2022  . TETANUS/TDAP  11/01/2027  . COVID-19 Vaccine  Completed  . Hepatitis C Screening  Completed  . HIV Screening  Completed    The following portions of the patient's history  were reviewed and updated as appropriate:  She  has a past medical history of Allergy, Anemia, Anxiety, Asthma, Diverticulosis, GERD (gastroesophageal reflux disease), Gestational HTN, Heart murmur, Hyperlipidemia, and Melanoma ([place]). She does not have any pertinent problems on file. She  has a past surgical history that includes moles removed; nasal polyps; Cholecystectomy; and Colonoscopy. Her family history includes Cancer in her maternal uncle and maternal uncle; Diabetes in her maternal grandfather; Heart disease (age of onset: 89) in her maternal grandfather; Hyperlipidemia in her maternal grandfather, maternal grandmother, and mother; Hypertension in her mother; Stroke in her maternal grandmother; Stroke (age of onset: 73) in her mother; Thyroid disease in her mother. She  reports that she has never smoked. She has never used smokeless tobacco. She reports that she does not drink alcohol and does not use drugs. She has a current medication list which includes the following prescription(s): azelastine, ibuprofen, acetaminophen, loratadine, lorazepam, omeprazole, ondansetron, and sumatriptan. Current Outpatient Medications on File Prior to Visit  Medication Sig Dispense Refill  . azelastine (ASTELIN) 0.1 % nasal spray Place 2 sprays into both nostrils 2 (two) times daily. 30 mL 6  . ibuprofen (ADVIL,MOTRIN) 200 MG tablet Take 200 mg by mouth every 6 (six) hours as needed for pain.    Marland Kitchen  Ibuprofen (MIDOL PO) Take 1 capsule by mouth 4 (four) times daily as needed.    . loratadine (CLARITIN) 10 MG tablet Take 10 mg by mouth daily.    Marland Kitchen LORazepam (ATIVAN) 1 MG tablet Take 1 tablet (1 mg total) by mouth daily as needed. 90 tablet 0  . omeprazole (PRILOSEC) 40 MG capsule TAKE 1 CAPSULE DAILY 90 capsule 3  . ondansetron (ZOFRAN-ODT) 4 MG disintegrating tablet Take 1-2 tablets (4-8 mg total) by mouth 3 (three) times daily as needed for nausea or vomiting. 20 tablet 0   No current facility-administered  medications on file prior to visit.   She has No Known Allergies..  Review of Systems Review of Systems  Constitutional: Negative for activity change, appetite change and fatigue.  HENT: Negative for hearing loss, congestion, tinnitus and ear discharge.  dentist q65m Eyes: Negative for visual disturbance (see optho q1y -- vision corrected to 20/20 with glasses).  Respiratory: Negative for cough, chest tightness and shortness of breath.   Cardiovascular: Negative for chest pain, palpitations and leg swelling.  Gastrointestinal: Negative for abdominal pain, diarrhea, constipation and abdominal distention.  Genitourinary: Negative for urgency, frequency, decreased urine volume and difficulty urinating.  Musculoskeletal: Negative for back pain, arthralgias and gait problem.  Skin: Negative for color change, pallor and rash.  Neurological: Negative for dizziness, light-headedness, numbness and headaches.  Hematological: Negative for adenopathy. Does not bruise/bleed easily.  Psychiatric/Behavioral: Negative for suicidal ideas, confusion, sleep disturbance, self-injury, dysphoric mood, decreased concentration and agitation.       Objective:    BP 132/76 (BP Location: Right Arm, Patient Position: Sitting, Cuff Size: Normal)   Pulse 66   Temp 97.8 F (36.6 C) (Temporal)   Resp 18   Ht 5' 0.2" (1.529 m)   Wt 116 lb 3.2 oz (52.7 kg)   SpO2 99%   BMI 22.54 kg/m  General appearance: alert, cooperative, appears stated age and no distress Head: Normocephalic, without obvious abnormality, atraumatic Eyes: negative findings: lids and lashes normal, conjunctivae and sclerae normal and pupils equal, round, reactive to light and accomodation Ears: normal TM's and external ear canals both ears Nose: Nares normal. Septum midline. Mucosa normal. No drainage or sinus tenderness. Throat: lips, mucosa, and tongue normal; teeth and gums normal Neck: no adenopathy, no carotid bruit, no JVD, supple,  symmetrical, trachea midline and thyroid not enlarged, symmetric, no tenderness/mass/nodules Back: symmetric, no curvature. ROM normal. No CVA tenderness. Lungs: clear to auscultation bilaterally Breasts: normal appearance, no masses or tenderness Heart: regular rate and rhythm, S1, S2 normal, no murmur, click, rub or gallop Abdomen: soft, non-tender; bowel sounds normal; no masses,  no organomegaly Pelvic: cervix normal in appearance, external genitalia normal, no adnexal masses or tenderness, no cervical motion tenderness, rectovaginal septum normal, uterus normal size, shape, and consistency, vagina normal without discharge and pap done Extremities: extremities normal, atraumatic, no cyanosis or edema Pulses: 2+ and symmetric Skin: Skin color, texture, turgor normal. No rashes or lesions Lymph nodes: Cervical, supraclavicular, and axillary nodes normal. Neurologic: Alert and oriented X 3, normal strength and tone. Normal symmetric reflexes. Normal coordination and gait    Assessment:    Healthy female exam.      Plan:    ghm utd Check labs  See After Visit Summary for Counseling Recommendations    1. Other migraine without status migrainosus, not intractable Stable, refill meds  - SUMAtriptan (IMITREX) 100 MG tablet; May repeat in 2 hours if headache persists or recurs.  Dispense: 27  tablet; Refill: 1  2. Preventative health care See above  - Cytology - PAP( [place]) - CBC with Differential/Platelet - Lipid panel - TSH - Comprehensive metabolic panel  3. Estrogen deficiency   - DG Bone Density; Future

## 2020-01-27 LAB — CYTOLOGY - PAP: Diagnosis: NEGATIVE

## 2020-01-28 ENCOUNTER — Other Ambulatory Visit: Payer: Self-pay

## 2020-01-28 ENCOUNTER — Telehealth: Payer: Self-pay | Admitting: Family Medicine

## 2020-01-28 NOTE — Telephone Encounter (Signed)
Called back. Instructions fixed

## 2020-01-28 NOTE — Telephone Encounter (Signed)
Call Back /[facility]  [phone]- Reference #:  [id number]  SUMAtriptan (IMITREX) 100 MG tablet [[id number]]    instructions are unclear for  prescription.

## 2020-05-11 ENCOUNTER — Other Ambulatory Visit: Payer: BC Managed Care – PPO

## 2020-10-02 ENCOUNTER — Other Ambulatory Visit: Payer: Self-pay | Admitting: Family Medicine

## 2020-10-02 DIAGNOSIS — G43809 Other migraine, not intractable, without status migrainosus: Secondary | ICD-10-CM

## 2021-01-26 ENCOUNTER — Encounter: Payer: Self-pay | Admitting: Family Medicine

## 2021-01-26 ENCOUNTER — Other Ambulatory Visit: Payer: Self-pay

## 2021-01-26 ENCOUNTER — Ambulatory Visit (INDEPENDENT_AMBULATORY_CARE_PROVIDER_SITE_OTHER): Payer: BC Managed Care – PPO | Admitting: Family Medicine

## 2021-01-26 VITALS — BP 143/64 | HR 63 | Temp 97.5°F | Resp 18 | Ht 60.0 in | Wt 110.6 lb

## 2021-01-26 DIAGNOSIS — I10 Essential (primary) hypertension: Secondary | ICD-10-CM

## 2021-01-26 DIAGNOSIS — G43809 Other migraine, not intractable, without status migrainosus: Secondary | ICD-10-CM | POA: Diagnosis not present

## 2021-01-26 DIAGNOSIS — F411 Generalized anxiety disorder: Secondary | ICD-10-CM

## 2021-01-26 DIAGNOSIS — G43909 Migraine, unspecified, not intractable, without status migrainosus: Secondary | ICD-10-CM | POA: Insufficient documentation

## 2021-01-26 DIAGNOSIS — Z1231 Encounter for screening mammogram for malignant neoplasm of breast: Secondary | ICD-10-CM

## 2021-01-26 DIAGNOSIS — E782 Mixed hyperlipidemia: Secondary | ICD-10-CM

## 2021-01-26 DIAGNOSIS — N943 Premenstrual tension syndrome: Secondary | ICD-10-CM

## 2021-01-26 DIAGNOSIS — E2839 Other primary ovarian failure: Secondary | ICD-10-CM | POA: Insufficient documentation

## 2021-01-26 DIAGNOSIS — Z0000 Encounter for general adult medical examination without abnormal findings: Secondary | ICD-10-CM | POA: Diagnosis not present

## 2021-01-26 LAB — CBC WITH DIFFERENTIAL/PLATELET
Basophils Absolute: 0 10*3/uL (ref 0.0–0.1)
Basophils Relative: 0.7 % (ref 0.0–3.0)
Eosinophils Absolute: 0.3 10*3/uL (ref 0.0–0.7)
Eosinophils Relative: 6 % — ABNORMAL HIGH (ref 0.0–5.0)
HCT: 37 % (ref 36.0–46.0)
Hemoglobin: 12.5 g/dL (ref 12.0–15.0)
Lymphocytes Relative: 36.2 % (ref 12.0–46.0)
Lymphs Abs: 2 10*3/uL (ref 0.7–4.0)
MCHC: 33.7 g/dL (ref 30.0–36.0)
MCV: 93.8 fl (ref 78.0–100.0)
Monocytes Absolute: 0.5 10*3/uL (ref 0.1–1.0)
Monocytes Relative: 8.8 % (ref 3.0–12.0)
Neutro Abs: 2.7 10*3/uL (ref 1.4–7.7)
Neutrophils Relative %: 48.3 % (ref 43.0–77.0)
Platelets: 270 10*3/uL (ref 150.0–400.0)
RBC: 3.94 Mil/uL (ref 3.87–5.11)
RDW: 13.4 % (ref 11.5–15.5)
WBC: 5.6 10*3/uL (ref 4.0–10.5)

## 2021-01-26 LAB — COMPREHENSIVE METABOLIC PANEL
ALT: 18 U/L (ref 0–35)
AST: 19 U/L (ref 0–37)
Albumin: 4.4 g/dL (ref 3.5–5.2)
Alkaline Phosphatase: 64 U/L (ref 39–117)
BUN: 17 mg/dL (ref 6–23)
CO2: 28 mEq/L (ref 19–32)
Calcium: 9.5 mg/dL (ref 8.4–10.5)
Chloride: 104 mEq/L (ref 96–112)
Creatinine, Ser: 0.87 mg/dL (ref 0.40–1.20)
GFR: 72.55 mL/min (ref >60.00)
Glucose, Bld: 88 mg/dL (ref 70–99)
Potassium: 4.4 mEq/L (ref 3.5–5.1)
Sodium: 140 mEq/L (ref 135–145)
Total Bilirubin: 0.4 mg/dL (ref 0.2–1.2)
Total Protein: 6.9 g/dL (ref 6.0–8.3)

## 2021-01-26 LAB — LIPID PANEL
Cholesterol: 232 mg/dL — ABNORMAL HIGH (ref 0–200)
HDL: 69.4 mg/dL (ref >39.00)
LDL Cholesterol: 155 mg/dL — ABNORMAL HIGH (ref 0–99)
NonHDL: 162.69
Total CHOL/HDL Ratio: 3
Triglycerides: 40 mg/dL (ref 0.0–149.0)
VLDL: 8 mg/dL (ref 0.0–40.0)

## 2021-01-26 LAB — TSH: TSH: 1.47 u[IU]/mL (ref 0.35–5.50)

## 2021-01-26 MED ORDER — SUMATRIPTAN SUCCINATE 100 MG PO TABS: ORAL_TABLET | ORAL | 1 refills | Status: DC

## 2021-01-26 NOTE — Progress Notes (Addendum)
Subjective:   By signing my name below, I, Shehryar Baig, attest that this documentation has been prepared under the direction and in the presence of Dr. Roma Schanz, DO. 01/26/2021   Patient ID: [name], female    DOB: [birth year], 60 y.o.   MRN: [id number]  Chief Complaint  Patient presents with   Annual Exam    Concerns/ questions: None Mammogram: [facility]- recently Pna: none Covid #3: none    HPI Patient is in today for a physical exam  [name] She denies having any fever, ear pain, congestion, sinus pain, sore throat, eye pain, chest pain, palpations, cough, SOB, wheezing, n/v/d, constipation, blood in stool, dysuria, frequency, hematuria, or headaches at this time. There have been no recent changes in her family history. She has had no recent surgeries. She has two Covid-19 vaccines at this time and is not interested in getting the booster vaccine. She is UTD on the shingles vaccine. She is not interested in the pneumonia vaccines. She is UTD on vision care. She is UTD on dental care.  She participates in exercise by walking everyday.  She is due for a mammogram. She is due for a bone density exam.    Past Medical History:  Diagnosis Date   Allergy    Anemia    SLIGHT AT LAST PCP OV   Anxiety    Asthma    as a child   Diverticulosis    GERD (gastroesophageal reflux disease)    OCCASIONAL   Gestational HTN    Heart murmur    Hyperlipidemia    Melanoma ([place])    back    Past Surgical History:  Procedure Laterality Date   CHOLECYSTECTOMY     COLONOSCOPY     moles removed     nasal polyps      Family History  Problem Relation Age of Onset   Hypertension Mother    Stroke Mother 44   Hyperlipidemia Mother    Thyroid disease Mother    Cancer Maternal Uncle        brain, lung   Stroke Maternal Grandmother    Hyperlipidemia Maternal  Grandmother    Hyperlipidemia Maternal Grandfather    Heart disease Maternal Grandfather 53       MI   Diabetes Maternal Grandfather    Cancer Maternal Uncle        lung, brain   Colon cancer Neg Hx    Rectal cancer Neg Hx    Stomach cancer Neg Hx    Esophageal cancer Neg Hx     Social History   Socioeconomic History   Marital status: Married    Spouse name: Not on file   Number of children: 1   Years of education: Not on file   Highest education level: Not on file  Occupational History   Occupation: Corporate treasurer    Employer: [employer]  Tobacco Use   Smoking status: Never   Smokeless tobacco: Never  Substance and Sexual Activity   Alcohol use: No   Drug use: No   Sexual activity: Yes    Partners: Male  Other Topics Concern   Not on file  Social History Narrative   Exercise--walk, daily   Social Determinants of Health   Financial Resource Strain: Not on file  Food Insecurity: Not on file  Transportation Needs: Not on file  Physical Activity: Not on file  Stress: Not on file  Social Connections: Not on file  Intimate Partner Violence: Not on file    Outpatient Medications Prior to Visit  Medication Sig Dispense Refill   azelastine (ASTELIN) 0.1 % nasal spray Place 2 sprays into both nostrils 2 (two) times daily. 30 mL 6   ibuprofen (ADVIL,MOTRIN) 200 MG tablet Take 200 mg by mouth every 6 (six) hours as needed for pain.     Ibuprofen (MIDOL PO) Take 1 capsule by mouth 4 (four) times daily as needed.     loratadine (CLARITIN) 10 MG tablet Take 10 mg by mouth daily.     LORazepam (ATIVAN) 1 MG tablet Take 1 tablet (1 mg total) by mouth daily as needed. 90 tablet 0   omeprazole (PRILOSEC) 40 MG capsule TAKE 1 CAPSULE DAILY 90 capsule 3   ondansetron (ZOFRAN-ODT) 4 MG disintegrating tablet Take 1-2 tablets (4-8 mg total) by mouth 3 (three) times daily as needed for nausea or vomiting. 20 tablet 0   SUMAtriptan (IMITREX) 100 MG tablet TAKE 1  TABLET IF NEEDED FORHEADACHE, MAY REPEAT IN 2  HOURS IF HEADACHE PERSISTS OR RECURS 27 tablet 1   No facility-administered medications prior to visit.    No Known Allergies  Review of Systems  Constitutional:  Negative for fever.  HENT:  Negative for congestion, ear pain, sinus pain and sore throat.   Eyes:  Negative for pain.  Respiratory:  Negative for cough, shortness of breath and wheezing.   Cardiovascular:  Negative for chest pain and palpitations.  Gastrointestinal:  Negative for blood in stool, constipation, diarrhea, nausea and vomiting.  Genitourinary:  Negative for dysuria, frequency and hematuria.  Neurological:  Negative for headaches.      Objective:    Physical Exam Constitutional:      General: She is not in acute distress.    Appearance: Normal appearance. She is not ill-appearing.  HENT:     Head: Normocephalic and atraumatic.     Right Ear: Tympanic membrane, ear canal and external ear normal.     Left Ear: Tympanic membrane, ear canal and external ear normal.  Eyes:     Extraocular Movements: Extraocular movements intact.     Pupils: Pupils are equal, round, and reactive to light.  Cardiovascular:     Rate and Rhythm: Normal rate and regular rhythm.     Pulses: Normal pulses.     Heart sounds: Normal heart sounds. No murmur heard. Pulmonary:     Effort: Pulmonary effort is normal. No respiratory distress.     Breath sounds: Normal breath sounds. No wheezing or rales.  Chest:  Breasts:    Right: Normal.     Left: Normal.  Abdominal:     General: Bowel sounds are normal. There is no distension.     Palpations: Abdomen is soft.     Tenderness: There is no abdominal tenderness. There is no guarding or rebound.  Skin:    General: Skin is warm and dry.  Neurological:     Mental Status: She is alert and oriented to person, place, and time.  Psychiatric:        Behavior: Behavior normal.        Judgment: Judgment normal.    BP (!) 143/64 (BP  Location: Left Arm, Patient Position: Sitting, Cuff Size: Normal)   Pulse 63   Temp (!) 97.5 F (36.4 C) (Oral)   Resp 18   Ht 5' (1.524 m)   Wt  110 lb 9.6 oz (50.2 kg)   SpO2 100%   BMI 21.60 kg/m  Wt Readings from Last 3 Encounters:  01/26/21 110 lb 9.6 oz (50.2 kg)  01/26/20 116 lb 3.2 oz (52.7 kg)  01/29/19 119 lb 12.8 oz (54.3 kg)    Diabetic Foot Exam - Simple   No data filed    Lab Results  Component Value Date   WBC 5.6 01/26/2021   HGB 12.5 01/26/2021   HCT 37.0 01/26/2021   PLT 270.0 01/26/2021   GLUCOSE 88 01/26/2021   CHOL 232 (H) 01/26/2021   TRIG 40.0 01/26/2021   HDL 69.40 01/26/2021   LDLDIRECT 169.7 05/20/2009   LDLCALC 155 (H) 01/26/2021   ALT 18 01/26/2021   AST 19 01/26/2021   NA 140 01/26/2021   K 4.4 01/26/2021   CL 104 01/26/2021   CREATININE 0.87 01/26/2021   BUN 17 01/26/2021   CO2 28 01/26/2021   TSH 1.47 01/26/2021   HGBA1C 5.7 03/24/2008    Lab Results  Component Value Date   TSH 1.47 01/26/2021   Lab Results  Component Value Date   WBC 5.6 01/26/2021   HGB 12.5 01/26/2021   HCT 37.0 01/26/2021   MCV 93.8 01/26/2021   PLT 270.0 01/26/2021   Lab Results  Component Value Date   NA 140 01/26/2021   K 4.4 01/26/2021   CO2 28 01/26/2021   GLUCOSE 88 01/26/2021   BUN 17 01/26/2021   CREATININE 0.87 01/26/2021   BILITOT 0.4 01/26/2021   ALKPHOS 64 01/26/2021   AST 19 01/26/2021   ALT 18 01/26/2021   PROT 6.9 01/26/2021   ALBUMIN 4.4 01/26/2021   CALCIUM 9.5 01/26/2021   GFR 72.55 01/26/2021   Lab Results  Component Value Date   CHOL 232 (H) 01/26/2021   Lab Results  Component Value Date   HDL 69.40 01/26/2021   Lab Results  Component Value Date   LDLCALC 155 (H) 01/26/2021   Lab Results  Component Value Date   TRIG 40.0 01/26/2021   Lab Results  Component Value Date   CHOLHDL 3 01/26/2021   Lab Results  Component Value Date   HGBA1C 5.7 03/24/2008   Mammogram: Last completed on 01/21/2020. Results  were normal. Repeat in 1 year. Colonoscopy: Last completed on 03/20/201. Results show moderate diverticulosis in the sigmoid colon otherwise results were normal. Repeat in 10 years.  Pap smear: Last completed 01/26/2020. Results were normal. Repeat in 3 years. Dexa: Not yet completed.    Assessment & Plan:   Problem List Items Addressed This Visit       Unprioritized   Estrogen deficiency   Relevant Orders   DG Bone Density   GENERALIZED ANXIETY DISORDER    Stable        Hyperlipidemia    Encourage heart healthy diet such as MIND or DASH diet, increase exercise, avoid trans fats, simple carbohydrates and processed foods, consider a krill or fish or flaxseed oil cap daily.        Hypertension    Well controlled, no changes to meds. Encouraged heart healthy diet such as the DASH diet and exercise as tolerated.        Migraine   Relevant Medications   SUMAtriptan (IMITREX) 100 MG tablet   Premenstrual tension syndrome    Stable        Preventative health care - Primary    ghm utd Check labs  See avs        Relevant  Orders   TSH (Completed)   Lipid panel (Completed)   CBC with Differential/Platelet (Completed)   Comprehensive metabolic panel (Completed)   Other Visit Diagnoses     Encounter for screening mammogram for malignant neoplasm of breast       Relevant Orders   MM 3D SCREEN BREAST BILATERAL        Meds ordered this encounter  Medications   SUMAtriptan (IMITREX) 100 MG tablet    Sig: TAKE 1 TABLET IF NEEDED FORHEADACHE, MAY REPEAT IN 2  HOURS IF HEADACHE PERSISTS OR RECURS    Dispense:  27 tablet    Refill:  1    I, Dr. Roma Schanz, DO., personally preformed the services described in this documentation.  All medical record entries made by the scribe were at my direction and in my presence.  I have reviewed the chart and discharge instructions (if applicable) and agree that the record reflects my personal performance and is accurate and  complete.01/26/2021   Engineering geologist as a Education administrator for [employer], DO.,have documented all relevant documentation on the behalf of Sydney Held, DO,as directed by  Sydney Held, DO while in the presence of Sydney Held, DO.   Sydney Held, DO

## 2021-01-26 NOTE — Assessment & Plan Note (Signed)
Stable

## 2021-01-26 NOTE — Assessment & Plan Note (Signed)
ghm utd Check labs  See avs  

## 2021-01-26 NOTE — Patient Instructions (Signed)
Preventive Care 68-60 Years Old, Female Preventive care refers to lifestyle choices and visits with your health care provider that can promote health and wellness. This includes: A yearly physical exam. This is also called an annual wellness visit. Regular dental and eye exams. Immunizations. Screening for certain conditions. Healthy lifestyle choices, such as: Eating a healthy diet. Getting regular exercise. Not using drugs or products that contain nicotine and tobacco. Limiting alcohol use. What can I expect for my preventive care visit? Physical exam Your health care provider will check your: Height and weight. These may be used to calculate your BMI (body mass index). BMI is a measurement that tells if you are at a healthy weight. Heart rate and blood pressure. Body temperature. Skin for abnormal spots. Counseling Your health care provider may ask you questions about your: Past medical problems. Family's medical history. Alcohol, tobacco, and drug use. Emotional well-being. Home life and relationship well-being. Sexual activity. Diet, exercise, and sleep habits. Work and work Statistician. Access to firearms. Method of birth control. Menstrual cycle. Pregnancy history. What immunizations do I need?  Vaccines are usually given at various ages, according to a schedule. Your health care provider will recommend vaccines for you based on your age, medicalhistory, and lifestyle or other factors, such as travel or where you work. What tests do I need? Blood tests Lipid and cholesterol levels. These may be checked every 5 years, or more often if you are over 37 years old. Hepatitis C test. Hepatitis B test. Screening Lung cancer screening. You may have this screening every year starting at age 30 if you have a 30-pack-year history of smoking and currently smoke or have quit within the past 15 years. Colorectal cancer screening. All adults should have this screening starting at  age 23 and continuing until age 3. Your health care provider may recommend screening at age 88 if you are at increased risk. You will have tests every 1-10 years, depending on your results and the type of screening test. Diabetes screening. This is done by checking your blood sugar (glucose) after you have not eaten for a while (fasting). You may have this done every 1-3 years. Mammogram. This may be done every 1-2 years. Talk with your health care provider about when you should start having regular mammograms. This may depend on whether you have a family history of breast cancer. BRCA-related cancer screening. This may be done if you have a family history of breast, ovarian, tubal, or peritoneal cancers. Pelvic exam and Pap test. This may be done every 3 years starting at age 79. Starting at age 54, this may be done every 5 years if you have a Pap test in combination with an HPV test. Other tests STD (sexually transmitted disease) testing, if you are at risk. Bone density scan. This is done to screen for osteoporosis. You may have this scan if you are at high risk for osteoporosis. Talk with your health care provider about your test results, treatment options,and if necessary, the need for more tests. Follow these instructions at home: Eating and drinking  Eat a diet that includes fresh fruits and vegetables, whole grains, lean protein, and low-fat dairy products. Take vitamin and mineral supplements as recommended by your health care provider. Do not drink alcohol if: Your health care provider tells you not to drink. You are pregnant, may be pregnant, or are planning to become pregnant. If you drink alcohol: Limit how much you have to 0-1 drink a day. Be aware  of how much alcohol is in your drink. In the [place] one drink equals one 12 oz bottle of beer (355 mL), one 5 oz glass of wine (148 mL), or one 1 oz glass of hard liquor (44 mL).  Lifestyle Take daily care of your teeth and  gums. Brush your teeth every morning and night with fluoride toothpaste. Floss one time each day. Stay active. Exercise for at least 30 minutes 5 or more days each week. Do not use any products that contain nicotine or tobacco, such as cigarettes, e-cigarettes, and chewing tobacco. If you need help quitting, ask your health care provider. Do not use drugs. If you are sexually active, practice safe sex. Use a condom or other form of protection to prevent STIs (sexually transmitted infections). If you do not wish to become pregnant, use a form of birth control. If you plan to become pregnant, see your health care provider for a prepregnancy visit. If told by your health care provider, take low-dose aspirin daily starting at age 29. Find healthy ways to cope with stress, such as: Meditation, yoga, or listening to music. Journaling. Talking to a trusted person. Spending time with friends and family. Safety Always wear your seat belt while driving or riding in a vehicle. Do not drive: If you have been drinking alcohol. Do not ride with someone who has been drinking. When you are tired or distracted. While texting. Wear a helmet and other protective equipment during sports activities. If you have firearms in your house, make sure you follow all gun safety procedures. What's next? Visit your health care provider once a year for an annual wellness visit. Ask your health care provider how often you should have your eyes and teeth checked. Stay up to date on all vaccines. This information is not intended to replace advice given to you by your health care provider. Make sure you discuss any questions you have with your healthcare provider. Document Revised: 03/29/2020 Document Reviewed: 03/06/2018 Elsevier Patient Education  2022 [employer].

## 2021-01-26 NOTE — Assessment & Plan Note (Signed)
Well controlled, no changes to meds. Encouraged heart healthy diet such as the DASH diet and exercise as tolerated.  °

## 2021-01-26 NOTE — Assessment & Plan Note (Signed)
Encourage heart healthy diet such as MIND or DASH diet, increase exercise, avoid trans fats, simple carbohydrates and processed foods, consider a krill or fish or flaxseed oil cap daily.  °

## 2021-01-31 ENCOUNTER — Other Ambulatory Visit: Payer: Self-pay | Admitting: Family Medicine

## 2021-01-31 DIAGNOSIS — E785 Hyperlipidemia, unspecified: Secondary | ICD-10-CM

## 2021-02-09 ENCOUNTER — Other Ambulatory Visit: Payer: Self-pay

## 2021-02-09 ENCOUNTER — Ambulatory Visit
Admission: RE | Admit: 2021-02-09 | Discharge: 2021-02-09 | Disposition: A | Discharge status: Home / Self Care | Payer: BC Managed Care – PPO | Source: Ambulatory Visit | Attending: Family Medicine | Admitting: Family Medicine

## 2021-02-09 DIAGNOSIS — E2839 Other primary ovarian failure: Secondary | ICD-10-CM

## 2021-03-22 ENCOUNTER — Ambulatory Visit
Admission: RE | Admit: 2021-03-22 | Discharge: 2021-03-22 | Disposition: A | Discharge status: Home / Self Care | Payer: BC Managed Care – PPO | Source: Ambulatory Visit | Attending: Family Medicine | Admitting: Family Medicine

## 2021-03-22 ENCOUNTER — Other Ambulatory Visit: Payer: Self-pay

## 2021-03-22 DIAGNOSIS — Z1231 Encounter for screening mammogram for malignant neoplasm of breast: Secondary | ICD-10-CM

## 2022-01-05 ENCOUNTER — Other Ambulatory Visit: Payer: Self-pay | Admitting: Family Medicine

## 2022-01-05 DIAGNOSIS — G43809 Other migraine, not intractable, without status migrainosus: Secondary | ICD-10-CM

## 2022-01-29 ENCOUNTER — Encounter: Payer: Self-pay | Admitting: Family Medicine

## 2022-01-29 ENCOUNTER — Ambulatory Visit (INDEPENDENT_AMBULATORY_CARE_PROVIDER_SITE_OTHER): Payer: BC Managed Care – PPO | Admitting: Family Medicine

## 2022-01-29 ENCOUNTER — Other Ambulatory Visit: Payer: Self-pay | Admitting: Family Medicine

## 2022-01-29 VITALS — BP 124/78 | HR 67 | Temp 98.0°F | Resp 18 | Ht 60.0 in | Wt 109.2 lb

## 2022-01-29 DIAGNOSIS — F419 Anxiety disorder, unspecified: Secondary | ICD-10-CM

## 2022-01-29 DIAGNOSIS — M810 Age-related osteoporosis without current pathological fracture: Secondary | ICD-10-CM | POA: Diagnosis not present

## 2022-01-29 DIAGNOSIS — G43809 Other migraine, not intractable, without status migrainosus: Secondary | ICD-10-CM

## 2022-01-29 DIAGNOSIS — Z0000 Encounter for general adult medical examination without abnormal findings: Secondary | ICD-10-CM

## 2022-01-29 DIAGNOSIS — E782 Mixed hyperlipidemia: Secondary | ICD-10-CM

## 2022-01-29 LAB — CBC WITH DIFFERENTIAL/PLATELET
Basophils Absolute: 0 10*3/uL (ref 0.0–0.1)
Basophils Relative: 0.8 % (ref 0.0–3.0)
Eosinophils Absolute: 0.4 10*3/uL (ref 0.0–0.7)
Eosinophils Relative: 7.4 % — ABNORMAL HIGH (ref 0.0–5.0)
HCT: 36.9 % (ref 36.0–46.0)
Hemoglobin: 12.4 g/dL (ref 12.0–15.0)
Lymphocytes Relative: 38.2 % (ref 12.0–46.0)
Lymphs Abs: 1.8 10*3/uL (ref 0.7–4.0)
MCHC: 33.7 g/dL (ref 30.0–36.0)
MCV: 93.8 fl (ref 78.0–100.0)
Monocytes Absolute: 0.4 10*3/uL (ref 0.1–1.0)
Monocytes Relative: 8.5 % (ref 3.0–12.0)
Neutro Abs: 2.1 10*3/uL (ref 1.4–7.7)
Neutrophils Relative %: 45.1 % (ref 43.0–77.0)
Platelets: 293 10*3/uL (ref 150.0–400.0)
RBC: 3.94 Mil/uL (ref 3.87–5.11)
RDW: 13.7 % (ref 11.5–15.5)
WBC: 4.8 10*3/uL (ref 4.0–10.5)

## 2022-01-29 LAB — COMPREHENSIVE METABOLIC PANEL
ALT: 15 U/L (ref 0–35)
AST: 17 U/L (ref 0–37)
Albumin: 4.7 g/dL (ref 3.5–5.2)
Alkaline Phosphatase: 54 U/L (ref 39–117)
BUN: 17 mg/dL (ref 6–23)
CO2: 28 mEq/L (ref 19–32)
Calcium: 9.6 mg/dL (ref 8.4–10.5)
Chloride: 102 mEq/L (ref 96–112)
Creatinine, Ser: 0.82 mg/dL (ref 0.40–1.20)
GFR: 77.34 mL/min (ref >60.00)
Glucose, Bld: 86 mg/dL (ref 70–99)
Potassium: 4.2 mEq/L (ref 3.5–5.1)
Sodium: 138 mEq/L (ref 135–145)
Total Bilirubin: 0.4 mg/dL (ref 0.2–1.2)
Total Protein: 6.8 g/dL (ref 6.0–8.3)

## 2022-01-29 LAB — LIPID PANEL
Cholesterol: 225 mg/dL — ABNORMAL HIGH (ref 0–200)
HDL: 66.2 mg/dL (ref >39.00)
LDL Cholesterol: 148 mg/dL — ABNORMAL HIGH (ref 0–99)
NonHDL: 158.69
Total CHOL/HDL Ratio: 3
Triglycerides: 55 mg/dL (ref 0.0–149.0)
VLDL: 11 mg/dL (ref 0.0–40.0)

## 2022-01-29 LAB — VITAMIN D 25 HYDROXY (VIT D DEFICIENCY, FRACTURES): VITD: 43.96 ng/mL (ref 30.00–100.00)

## 2022-01-29 LAB — TSH: TSH: 1.85 u[IU]/mL (ref 0.35–5.50)

## 2022-01-29 MED ORDER — SUMATRIPTAN SUCCINATE 100 MG PO TABS: ORAL_TABLET | ORAL | 1 refills | Status: DC

## 2022-01-29 MED ORDER — ONDANSETRON 4 MG PO TBDP: 4.0000 mg | ORAL_TABLET | Freq: Three times a day (TID) | ORAL | 0 refills | Status: DC | PRN

## 2022-01-29 MED ORDER — LORAZEPAM 1 MG PO TABS: 1.0000 mg | ORAL_TABLET | Freq: Every day | ORAL | 0 refills | Status: DC | PRN

## 2022-01-29 MED ORDER — AZELASTINE HCL 0.1 % NA SOLN: 2.0000 | Freq: Two times a day (BID) | NASAL | 6 refills | Status: DC

## 2022-01-29 NOTE — Progress Notes (Signed)
Subjective:   By signing my name below, I, [name], attest that this documentation has been prepared under the direction and in the presence of Ann Held DO 01/29/2022    Patient ID: [name], female    DOB: June 06, 1961, 61 y.o.   MRN: [id number]  No chief complaint on file.   HPI Patient is in today for a comprehensive physical exam  She is requesting a refill of 100 Mg of Imitrex, 4 Mg of Zofran-ODT, 1 Mg of Ativan, and 0.1% of Astelin.   She is asking whether it's normal to get a "catch" in her right side that she first experienced about four months ago from an event. She notes that occasionally when she lays on her side, she can feel it.    She regularly sees Dr. Renda Rolls.    She denies having any fever, new muscle pain, joint pain , new moles, congestion, sinus pain, sore throat, chest pain, palpations, cough, SOB ,wheezing,n/v/d constipation, blood in stool, dysuria, frequency, hematuria, at this time  She denies any changes to her family history. She reports no recent surgeries.  Colonoscopy last completed on 09/25/2012. She is due in 2024.  Dexa last completed on 02/09/2021. She states that results weren't very good and wants to get her Vitamin D checked. She is currently taking 2000 Units of Vitamin D supplements. She does not take calcium or multivitamin supplements. She states that she does eat yogurt.  Pap Smear last completed on 01/26/2020. She is due in 2024.She does not see a gynecologist.  Mammogram last completed on 03/22/2021 She is UTD on Shingles vaccine. She has had two Covid 19 vaccines. She is exercising regularly.   Past Medical History:  Diagnosis Date   Allergy    Anemia    SLIGHT AT LAST PCP OV   Anxiety    Asthma    as a child   Diverticulosis    GERD (gastroesophageal reflux disease)    OCCASIONAL   Gestational HTN    Heart murmur    Hyperlipidemia    Melanoma ([place])    back    Past Surgical History:  Procedure  Laterality Date   CHOLECYSTECTOMY     COLONOSCOPY     moles removed     nasal polyps      Family History  Problem Relation Age of Onset   Hypertension Mother    Stroke Mother 6   Hyperlipidemia Mother    Thyroid disease Mother    Cancer Maternal Uncle        brain, lung   Stroke Maternal Grandmother    Hyperlipidemia Maternal Grandmother    Hyperlipidemia Maternal Grandfather    Heart disease Maternal Grandfather 62       MI   Diabetes Maternal Grandfather    Cancer Maternal Uncle        lung, brain   Colon cancer Neg Hx    Rectal cancer Neg Hx    Stomach cancer Neg Hx    Esophageal cancer Neg Hx     Social History   Socioeconomic History   Marital status: Married    Spouse name: Not on file   Number of children: 1   Years of education: Not on file   Highest education level: Not on file  Occupational History   Occupation: Corporate treasurer    Employer: [employer]  Tobacco Use   Smoking status: Never   Smokeless tobacco: Never  Substance and Sexual Activity  Alcohol use: No   Drug use: No   Sexual activity: Yes    Partners: Male  Other Topics Concern   Not on file  Social History Narrative   Exercise--walk, daily   Social Determinants of Health   Financial Resource Strain: Not on file  Food Insecurity: Not on file  Transportation Needs: Not on file  Physical Activity: Not on file  Stress: Not on file  Social Connections: Not on file  Intimate Partner Violence: Not on file    Outpatient Medications Prior to Visit  Medication Sig Dispense Refill   azelastine (ASTELIN) 0.1 % nasal spray Place 2 sprays into both nostrils 2 (two) times daily. 30 mL 6   ibuprofen (ADVIL,MOTRIN) 200 MG tablet Take 200 mg by mouth every 6 (six) hours as needed for pain.     Ibuprofen (MIDOL PO) Take 1 capsule by mouth 4 (four) times daily as needed.     loratadine (CLARITIN) 10 MG tablet Take 10 mg by mouth daily.     LORazepam (ATIVAN) 1 MG tablet Take 1  tablet (1 mg total) by mouth daily as needed. 90 tablet 0   omeprazole (PRILOSEC) 40 MG capsule TAKE 1 CAPSULE DAILY 90 capsule 3   ondansetron (ZOFRAN-ODT) 4 MG disintegrating tablet Take 1-2 tablets (4-8 mg total) by mouth 3 (three) times daily as needed for nausea or vomiting. 20 tablet 0   SUMAtriptan (IMITREX) 100 MG tablet TAKE 1 TABLET IF NEEDED FORHEADACHE, MAY REPEAT IN 2  HOURS IF HEADACHE PERSISTS OR RECURS. 27 tablet 1   No facility-administered medications prior to visit.    No Known Allergies  Review of Systems  Constitutional:  Negative for fever.  HENT:  Negative for congestion, sinus pain and sore throat.   Respiratory:  Negative for cough, shortness of breath and wheezing.   Cardiovascular:  Negative for chest pain and palpitations.  Gastrointestinal:  Negative for blood in stool, constipation, diarrhea, nausea and vomiting.  Genitourinary:  Negative for dysuria, frequency and hematuria.  Skin:        (-) New Moles       Objective:    Physical Exam Constitutional:      General: She is not in acute distress.    Appearance: Normal appearance. She is not ill-appearing.  HENT:     Head: Normocephalic and atraumatic.     Right Ear: Tympanic membrane, ear canal and external ear normal.     Left Ear: Tympanic membrane, ear canal and external ear normal.  Cardiovascular:     Rate and Rhythm: Normal rate and regular rhythm.     Heart sounds: Normal heart sounds. No murmur heard.    No gallop.  Pulmonary:     Effort: Pulmonary effort is normal. No respiratory distress.     Breath sounds: Normal breath sounds. No wheezing or rales.  Abdominal:     General: Bowel sounds are normal. There is no distension.     Palpations: Abdomen is soft.     Tenderness: There is no abdominal tenderness. There is no guarding.  Skin:    General: Skin is warm and dry.  Neurological:     Mental Status: She is alert and oriented to person, place, and time.  Psychiatric:         Judgment: Judgment normal.     There were no vitals taken for this visit. Wt Readings from Last 3 Encounters:  01/26/21 110 lb 9.6 oz (50.2 kg)  01/26/20 116 lb 3.2 oz (52.7  kg)  01/29/19 119 lb 12.8 oz (54.3 kg)    Diabetic Foot Exam - Simple   No data filed    Lab Results  Component Value Date   WBC 5.6 01/26/2021   HGB 12.5 01/26/2021   HCT 37.0 01/26/2021   PLT 270.0 01/26/2021   GLUCOSE 88 01/26/2021   CHOL 232 (H) 01/26/2021   TRIG 40.0 01/26/2021   HDL 69.40 01/26/2021   LDLDIRECT 169.7 05/20/2009   LDLCALC 155 (H) 01/26/2021   ALT 18 01/26/2021   AST 19 01/26/2021   NA 140 01/26/2021   K 4.4 01/26/2021   CL 104 01/26/2021   CREATININE 0.87 01/26/2021   BUN 17 01/26/2021   CO2 28 01/26/2021   TSH 1.47 01/26/2021   HGBA1C 5.7 03/24/2008    Lab Results  Component Value Date   TSH 1.47 01/26/2021   Lab Results  Component Value Date   WBC 5.6 01/26/2021   HGB 12.5 01/26/2021   HCT 37.0 01/26/2021   MCV 93.8 01/26/2021   PLT 270.0 01/26/2021   Lab Results  Component Value Date   NA 140 01/26/2021   K 4.4 01/26/2021   CO2 28 01/26/2021   GLUCOSE 88 01/26/2021   BUN 17 01/26/2021   CREATININE 0.87 01/26/2021   BILITOT 0.4 01/26/2021   ALKPHOS 64 01/26/2021   AST 19 01/26/2021   ALT 18 01/26/2021   PROT 6.9 01/26/2021   ALBUMIN 4.4 01/26/2021   CALCIUM 9.5 01/26/2021   GFR 72.55 01/26/2021   Lab Results  Component Value Date   CHOL 232 (H) 01/26/2021   Lab Results  Component Value Date   HDL 69.40 01/26/2021   Lab Results  Component Value Date   LDLCALC 155 (H) 01/26/2021   Lab Results  Component Value Date   TRIG 40.0 01/26/2021   Lab Results  Component Value Date   CHOLHDL 3 01/26/2021   Lab Results  Component Value Date   HGBA1C 5.7 03/24/2008       Assessment & Plan:   Problem List Items Addressed This Visit   None    No orders of the defined types were placed in this encounter.   I, [name], personally  preformed the services described in this documentation.  All medical record entries made by the scribe were at my direction and in my presence.  I have reviewed the chart and discharge instructions (if applicable) and agree that the record reflects my personal performance and is accurate and complete. 01/29/2022   I,Amber Collins,acting as a scribe for [employer], DO.,have documented all relevant documentation on the behalf of Ann Held, DO,as directed by  Ann Held, DO while in the presence of Ann Held, DO.   [employer]

## 2022-01-29 NOTE — Assessment & Plan Note (Signed)
Stable  con't prn xanax

## 2022-01-29 NOTE — Assessment & Plan Note (Signed)
ghm utd Check labs  See avs  

## 2022-01-29 NOTE — Assessment & Plan Note (Signed)
Encourage heart healthy diet such as MIND or DASH diet, increase exercise, avoid trans fats, simple carbohydrates and processed foods, consider a krill or fish or flaxseed oil cap daily.  °

## 2022-01-29 NOTE — Patient Instructions (Addendum)
Osteoporosis  Osteoporosis happens when the bones become thin and less dense than normal. Osteoporosis makes bones more brittle and fragile and more likely to break (fracture). Over time, osteoporosis can cause your bones to become so weak that they fracture after a minor fall. Bones in the hip, wrist, and spine are most likely to fracture due to osteoporosis. What are the causes? The exact cause of this condition is not known. What increases the risk? You are more likely to develop this condition if you: Have family members with this condition. Have poor nutrition. Use the following: Steroid medicines, such as prednisone. Anti-seizure medicines. Nicotine or tobacco, such as cigarettes, e-cigarettes, and chewing tobacco. Are female. Are age 52 or older. Are not physically active (are sedentary). Are of European or Asian descent. Have a small body frame. What are the signs or symptoms? A fracture might be the first sign of osteoporosis, especially if the fracture results from a fall or injury that usually would not cause a bone to break. Other signs and symptoms include: Pain in the neck or low back. Stooped posture. Loss of height. How is this diagnosed? This condition may be diagnosed based on: Your medical history. A physical exam. A bone mineral density test, also called a DXA or DEXA test (dual-energy X-ray absorptiometry test). This test uses X-rays to measure the amount of minerals in your bones. How is this treated? This condition may be treated by: Making lifestyle changes, such as: Including foods with more calcium and vitamin D in your diet. Doing weight-bearing and muscle-strengthening exercises. Stopping tobacco use. Limiting alcohol intake. Taking medicine to slow the process of bone loss or to increase bone density. Taking daily supplements of calcium and vitamin D. Taking hormone replacement medicines, such as estrogen for women and testosterone for  men. Monitoring your levels of calcium and vitamin D. The goal of treatment is to strengthen your bones and lower your risk for a fracture. Follow these instructions at home: Eating and drinking Include calcium and vitamin D in your diet. Calcium is important for bone health, and vitamin D helps your body absorb calcium. Good sources of calcium and vitamin D include: Certain fatty fish, such as salmon and tuna. Products that have calcium and vitamin D added to them (are fortified), such as fortified cereals. Egg yolks. Cheese. Liver.  Activity Do exercises as told by your health care provider. Ask your health care provider what exercises and activities are safe for you. You should do: Exercises that make you work against gravity (weight-bearing exercises), such as tai chi, yoga, or walking. Exercises to strengthen muscles, such as lifting weights. Lifestyle Do not drink alcohol if: Your health care provider tells you not to drink. You are pregnant, may be pregnant, or are planning to become pregnant. If you drink alcohol: Limit how much you use to: 0-1 drink a day for women. 0-2 drinks a day for men. Know how much alcohol is in your drink. In the [place] one drink equals one 12 oz bottle of beer (355 mL), one 5 oz glass of wine (148 mL), or one 1 oz glass of hard liquor (44 mL). Do not use any products that contain nicotine or tobacco, such as cigarettes, e-cigarettes, and chewing tobacco. If you need help quitting, ask your health care provider. Preventing falls Use devices to help you move around (mobility aids) as needed, such as canes, walkers, scooters, or crutches. Keep rooms well-lit and clutter-free. Remove tripping hazards from walkways, including cords and  throw rugs. Install grab bars in bathrooms and safety rails on stairs. Use rubber mats in the bathroom and other areas that are often wet or slippery. Wear closed-toe shoes that fit well and support your feet. Wear shoes  that have rubber soles or low heels. Review your medicines with your health care provider. Some medicines can cause dizziness or changes in blood pressure, which can increase your risk of falling. General instructions Take over-the-counter and prescription medicines only as told by your health care provider. Keep all follow-up visits. This is important. Contact a health care provider if: You have never been screened for osteoporosis and you are: A woman who is age 16 or older. A man who is age 107 or older. Get help right away if: You fall or injure yourself. Summary Osteoporosis is thinning and loss of density in your bones. This makes bones more brittle and fragile and more likely to break (fracture),even with minor falls. The goal of treatment is to strengthen your bones and lower your risk for a fracture. Include calcium and vitamin D in your diet. Calcium is important for bone health, and vitamin D helps your body absorb calcium. Talk with your health care provider about screening for osteoporosis if you are a woman who is age 51 or older, or a man who is age 27 or older. This information is not intended to replace advice given to you by your health care provider. Make sure you discuss any questions you have with your health care provider. Document Revised: 12/10/2019 Document Reviewed: 12/10/2019 Elsevier Patient Education  2023 Elsevier Inc. Preventive Care 50-39 Years Old, Female Preventive care refers to lifestyle choices and visits with your health care provider that can promote health and wellness. Preventive care visits are also called wellness exams. What can I expect for my preventive care visit? Counseling Your health care provider may ask you questions about your: Medical history, including: Past medical problems. Family medical history. Pregnancy history. Current health, including: Menstrual cycle. Method of birth control. Emotional well-being. Home life and  relationship well-being. Sexual activity and sexual health. Lifestyle, including: Alcohol, nicotine or tobacco, and drug use. Access to firearms. Diet, exercise, and sleep habits. Work and work Statistician. Sunscreen use. Safety issues such as seatbelt and bike helmet use. Physical exam Your health care provider will check your: Height and weight. These may be used to calculate your BMI (body mass index). BMI is a measurement that tells if you are at a healthy weight. Waist circumference. This measures the distance around your waistline. This measurement also tells if you are at a healthy weight and may help predict your risk of certain diseases, such as type 2 diabetes and high blood pressure. Heart rate and blood pressure. Body temperature. Skin for abnormal spots. What immunizations do I need?  Vaccines are usually given at various ages, according to a schedule. Your health care provider will recommend vaccines for you based on your age, medical history, and lifestyle or other factors, such as travel or where you work. What tests do I need? Screening Your health care provider may recommend screening tests for certain conditions. This may include: Lipid and cholesterol levels. Diabetes screening. This is done by checking your blood sugar (glucose) after you have not eaten for a while (fasting). Pelvic exam and Pap test. Hepatitis B test. Hepatitis C test. HIV (human immunodeficiency virus) test. STI (sexually transmitted infection) testing, if you are at risk. Lung cancer screening. Colorectal cancer screening. Mammogram. Talk with your  health care provider about when you should start having regular mammograms. This may depend on whether you have a family history of breast cancer. BRCA-related cancer screening. This may be done if you have a family history of breast, ovarian, tubal, or peritoneal cancers. Bone density scan. This is done to screen for osteoporosis. Talk with your  health care provider about your test results, treatment options, and if necessary, the need for more tests. Follow these instructions at home: Eating and drinking  Eat a diet that includes fresh fruits and vegetables, whole grains, lean protein, and low-fat dairy products. Take vitamin and mineral supplements as recommended by your health care provider. Do not drink alcohol if: Your health care provider tells you not to drink. You are pregnant, may be pregnant, or are planning to become pregnant. If you drink alcohol: Limit how much you have to 0-1 drink a day. Know how much alcohol is in your drink. In the [place] one drink equals one 12 oz bottle of beer (355 mL), one 5 oz glass of wine (148 mL), or one 1 oz glass of hard liquor (44 mL). Lifestyle Brush your teeth every morning and night with fluoride toothpaste. Floss one time each day. Exercise for at least 30 minutes 5 or more days each week. Do not use any products that contain nicotine or tobacco. These products include cigarettes, chewing tobacco, and vaping devices, such as e-cigarettes. If you need help quitting, ask your health care provider. Do not use drugs. If you are sexually active, practice safe sex. Use a condom or other form of protection to prevent STIs. If you do not wish to become pregnant, use a form of birth control. If you plan to become pregnant, see your health care provider for a prepregnancy visit. Take aspirin only as told by your health care provider. Make sure that you understand how much to take and what form to take. Work with your health care provider to find out whether it is safe and beneficial for you to take aspirin daily. Find healthy ways to manage stress, such as: Meditation, yoga, or listening to music. Journaling. Talking to a trusted person. Spending time with friends and family. Minimize exposure to UV radiation to reduce your risk of skin cancer. Safety Always wear your seat belt while driving  or riding in a vehicle. Do not drive: If you have been drinking alcohol. Do not ride with someone who has been drinking. When you are tired or distracted. While texting. If you have been using any mind-altering substances or drugs. Wear a helmet and other protective equipment during sports activities. If you have firearms in your house, make sure you follow all gun safety procedures. Seek help if you have been physically or sexually abused. What's next? Visit your health care provider once a year for an annual wellness visit. Ask your health care provider how often you should have your eyes and teeth checked. Stay up to date on all vaccines. This information is not intended to replace advice given to you by your health care provider. Make sure you discuss any questions you have with your health care provider. Document Revised: 12/21/2020 Document Reviewed: 12/21/2020 Elsevier Patient Education  [place].

## 2022-01-29 NOTE — Assessment & Plan Note (Signed)
Stable  con't imtrex

## 2022-06-08 ENCOUNTER — Other Ambulatory Visit: Payer: Self-pay | Admitting: Family Medicine

## 2022-06-08 DIAGNOSIS — Z1231 Encounter for screening mammogram for malignant neoplasm of breast: Secondary | ICD-10-CM

## 2022-08-03 ENCOUNTER — Ambulatory Visit
Admission: RE | Admit: 2022-08-03 | Discharge: 2022-08-03 | Disposition: A | Discharge status: Home / Self Care | Payer: BC Managed Care – PPO | Source: Ambulatory Visit | Attending: Family Medicine | Admitting: Family Medicine

## 2022-08-03 DIAGNOSIS — Z1231 Encounter for screening mammogram for malignant neoplasm of breast: Secondary | ICD-10-CM

## 2022-08-08 ENCOUNTER — Other Ambulatory Visit: Payer: Self-pay | Admitting: Family Medicine

## 2022-08-08 DIAGNOSIS — R928 Other abnormal and inconclusive findings on diagnostic imaging of breast: Secondary | ICD-10-CM

## 2022-08-09 ENCOUNTER — Ambulatory Visit
Admission: RE | Admit: 2022-08-09 | Discharge: 2022-08-09 | Disposition: A | Discharge status: Home / Self Care | Payer: BC Managed Care – PPO | Source: Ambulatory Visit | Attending: Family Medicine | Admitting: Family Medicine

## 2022-08-09 ENCOUNTER — Other Ambulatory Visit: Payer: Self-pay | Admitting: Family Medicine

## 2022-08-09 DIAGNOSIS — R928 Other abnormal and inconclusive findings on diagnostic imaging of breast: Secondary | ICD-10-CM

## 2022-08-09 DIAGNOSIS — N6489 Other specified disorders of breast: Secondary | ICD-10-CM

## 2022-09-21 ENCOUNTER — Other Ambulatory Visit: Payer: Self-pay | Admitting: Family Medicine

## 2022-09-21 DIAGNOSIS — G43809 Other migraine, not intractable, without status migrainosus: Secondary | ICD-10-CM

## 2022-10-12 ENCOUNTER — Other Ambulatory Visit: Payer: Self-pay | Admitting: Family Medicine

## 2022-10-23 ENCOUNTER — Encounter: Payer: Self-pay | Admitting: Internal Medicine

## 2022-11-22 NOTE — Progress Notes (Signed)
Subjective:   By signing my name below, I, [name], attest that this documentation has been prepared under the direction and in the presence of Donato Schultz, DO 11/23/22   Patient ID: [name], female    DOB: [birth year], 62 y.o.   MRN: [id number]  Chief Complaint  Patient presents with   Annual Exam    Pt states fasting     HPI Patient is in today for a comprehensive physical exam.  She complains of her right ear feeling plugged, which tends to be worse in the morning. She states she tends to pull on her ear to help. She previously has had an ear irrigation that helped.   Last pap: 01/26/2020. Results were normal.   Last mammogram: 08/09/2022.   Last colonoscopy: 09/25/2012. Diverticulosis. Otherwise normal. Her next colonoscopy is due and she plans to schedule her appointment soon.    Last bone density: 02/09/2021. Revealed osteoporosis.   Past Medical History:  Diagnosis Date   Allergy    Anemia    SLIGHT AT LAST PCP OV   Anxiety    Asthma    as a child   Diverticulosis    GERD (gastroesophageal reflux disease)    OCCASIONAL   Gestational HTN    Heart murmur    Hyperlipidemia    Melanoma (HCC)    back    Past Surgical History:  Procedure Laterality Date   CHOLECYSTECTOMY     COLONOSCOPY     moles removed     nasal polyps      Family History  Problem Relation Age of Onset   Hypertension Mother    Stroke Mother 48   Hyperlipidemia Mother    Thyroid disease Mother    Stroke Maternal Grandmother    Hyperlipidemia Maternal Grandmother    Hyperlipidemia Maternal Grandfather    Heart disease Maternal Grandfather 53       MI   Diabetes Maternal Grandfather    Cancer Maternal Uncle        brain, lung   Cancer Maternal Uncle        lung, brain   Colon cancer Neg Hx    Rectal cancer Neg Hx    Stomach cancer Neg Hx    Esophageal cancer Neg Hx     Social History   Socioeconomic History   Marital status: Married    Spouse name: Not  on file   Number of children: 1   Years of education: Not on file   Highest education level: Not on file  Occupational History   Occupation: Research officer, trade union    Employer: [employer]  Tobacco Use   Smoking status: Never   Smokeless tobacco: Never  Substance and Sexual Activity   Alcohol use: No   Drug use: No   Sexual activity: Yes    Partners: Male  Other Topics Concern   Not on file  Social History Narrative   Exercise--walk, daily   Social Determinants of Health   Financial Resource Strain: Not on file  Food Insecurity: Not on file  Transportation Needs: Not on file  Physical Activity: Not on file  Stress: Not on file  Social Connections: Not on file  Intimate Partner Violence: Not on file    Outpatient Medications Prior to Visit  Medication Sig Dispense Refill   Azelastine HCl 137 MCG/SPRAY SOLN USE 2 SPRAYS IN EACH       NOSTRIL TWICE DAILY 30 mL 6   ibuprofen (ADVIL,MOTRIN) 200  MG tablet Take 200 mg by mouth every 6 (six) hours as needed for pain.     Ibuprofen (MIDOL PO) Take 1 capsule by mouth 4 (four) times daily as needed.     loratadine (CLARITIN) 10 MG tablet Take 10 mg by mouth daily.     LORazepam (ATIVAN) 1 MG tablet Take 1 tablet (1 mg total) by mouth daily as needed. 90 tablet 0   omeprazole (PRILOSEC) 40 MG capsule TAKE 1 CAPSULE DAILY 90 capsule 3   ondansetron (ZOFRAN-ODT) 4 MG disintegrating tablet Take 1-2 tablets (4-8 mg total) by mouth 3 (three) times daily as needed for nausea or vomiting. 20 tablet 0   SUMAtriptan (IMITREX) 100 MG tablet TAKE 1 TABLET AT ONSET OF  HEADACHE; MAY REPEAT IN 2  HOURS IF HEADACHE PERSISTS OR RECURS 27 tablet 1   No facility-administered medications prior to visit.    No Known Allergies  Review of Systems  Constitutional:  Negative for fever and malaise/fatigue.  HENT:  Negative for congestion.   Eyes:  Negative for blurred vision.  Respiratory:  Negative for cough and shortness of breath.    Cardiovascular:  Negative for chest pain, palpitations and leg swelling.  Gastrointestinal:  Negative for abdominal pain, blood in stool, nausea and vomiting.  Genitourinary:  Negative for dysuria and frequency.  Musculoskeletal:  Negative for back pain and falls.  Skin:  Negative for rash.  Neurological:  Negative for dizziness, loss of consciousness and headaches.  Endo/Heme/Allergies:  Negative for environmental allergies.  Psychiatric/Behavioral:  Negative for depression. The patient is not nervous/anxious.        Objective:    Physical Exam Vitals and nursing note reviewed.  Constitutional:      Appearance: She is well-developed.  HENT:     Head: Normocephalic and atraumatic.     Right Ear: There is impacted cerumen.  Eyes:     Conjunctiva/sclera: Conjunctivae normal.  Neck:     Thyroid: No thyromegaly.     Vascular: No carotid bruit or JVD.  Cardiovascular:     Rate and Rhythm: Normal rate and regular rhythm.     Heart sounds: Normal heart sounds. No murmur heard. Pulmonary:     Effort: Pulmonary effort is normal. No respiratory distress.     Breath sounds: Normal breath sounds. No wheezing or rales.  Chest:     Chest wall: No tenderness.  Musculoskeletal:        General: Normal range of motion.     Cervical back: Normal range of motion and neck supple.  Neurological:     General: No focal deficit present.     Mental Status: She is alert and oriented to person, place, and time.  Psychiatric:        Mood and Affect: Mood normal.        Behavior: Behavior normal.        Thought Content: Thought content normal.        Judgment: Judgment normal.     BP 120/70 (BP Location: Left Arm, Patient Position: Sitting, Cuff Size: Normal)   Pulse 65   Temp 98.3 F (36.8 C) (Oral)   Resp 18   Ht 5' (1.524 m)   Wt 103 lb 6.4 oz (46.9 kg)   SpO2 99%   BMI 20.19 kg/m  Wt Readings from Last 3 Encounters:  11/23/22 103 lb 6.4 oz (46.9 kg)  01/29/22 109 lb 3.2 oz (49.5  kg)  01/26/21 110 lb 9.6 oz (50.2 kg)  Assessment & Plan:  Primary hypertension Assessment & Plan: Well controlled, no changes to meds. Encouraged heart healthy diet such as the DASH diet and exercise as tolerated.    Orders: -     CBC with Differential/Platelet -     Comprehensive metabolic panel -     Lipid panel -     TSH  Preventative health care Assessment & Plan: Ghm utd Check labs  Health Maintenance  Topic Date Due   COLONOSCOPY (Pts 45-79yrs Insurance coverage will need to be confirmed)  09/26/2022   COVID-19 Vaccine (3 - Pfizer risk series) 12/09/2022 (Originally 10/23/2019)   PAP SMEAR-Modifier  01/26/2023   INFLUENZA VACCINE  02/07/2023   MAMMOGRAM  08/04/2023   DTaP/Tdap/Td (4 - Td or Tdap) 11/01/2027   Hepatitis C Screening  Completed   HIV Screening  Completed   Zoster Vaccines- Shingrix  Completed   HPV VACCINES  Aged Out     Orders: -     CBC with Differential/Platelet -     Comprehensive metabolic panel -     Lipid panel -     TSH  Mixed hyperlipidemia Assessment & Plan: Encourage heart healthy diet such as MIND or DASH diet, increase exercise, avoid trans fats, simple carbohydrates and processed foods, consider a krill or fish or flaxseed oil cap daily.    Orders: -     Comprehensive metabolic panel -     Lipid panel     I,Rachel Rivera,acting as a scribe for Donato Schultz, DO.,have documented all relevant documentation on the behalf of Donato Schultz, DO,as directed by  Donato Schultz, DO while in the presence of Donato Schultz, DO.   I, Donato Schultz, DO, personally preformed the services described in this documentation.  All medical record entries made by the scribe were at my direction and in my presence.  I have reviewed the chart and discharge instructions (if applicable) and agree that the record reflects my personal performance and is accurate and complete. 11/23/22   Donato Schultz, DO

## 2022-11-23 ENCOUNTER — Ambulatory Visit (INDEPENDENT_AMBULATORY_CARE_PROVIDER_SITE_OTHER): Payer: BC Managed Care – PPO | Admitting: Family Medicine

## 2022-11-23 ENCOUNTER — Encounter: Payer: Self-pay | Admitting: Family Medicine

## 2022-11-23 VITALS — BP 120/70 | HR 65 | Temp 98.3°F | Resp 18 | Ht 60.0 in | Wt 103.4 lb

## 2022-11-23 DIAGNOSIS — Z0000 Encounter for general adult medical examination without abnormal findings: Secondary | ICD-10-CM

## 2022-11-23 DIAGNOSIS — I10 Essential (primary) hypertension: Secondary | ICD-10-CM | POA: Diagnosis not present

## 2022-11-23 DIAGNOSIS — E782 Mixed hyperlipidemia: Secondary | ICD-10-CM | POA: Diagnosis not present

## 2022-11-23 NOTE — Assessment & Plan Note (Signed)
Encourage heart healthy diet such as MIND or DASH diet, increase exercise, avoid trans fats, simple carbohydrates and processed foods, consider a krill or fish or flaxseed oil cap daily.  °

## 2022-11-23 NOTE — Assessment & Plan Note (Signed)
Well controlled, no changes to meds. Encouraged heart healthy diet such as the DASH diet and exercise as tolerated.  °

## 2022-11-23 NOTE — Assessment & Plan Note (Signed)
Ghm utd Check labs  Health Maintenance  Topic Date Due   COLONOSCOPY (Pts 45-47yrs Insurance coverage will need to be confirmed)  09/26/2022   COVID-19 Vaccine (3 - Pfizer risk series) 12/09/2022 (Originally 10/23/2019)   PAP SMEAR-Modifier  01/26/2023   INFLUENZA VACCINE  02/07/2023   MAMMOGRAM  08/04/2023   DTaP/Tdap/Td (4 - Td or Tdap) 11/01/2027   Hepatitis C Screening  Completed   HIV Screening  Completed   Zoster Vaccines- Shingrix  Completed   HPV VACCINES  Aged Out

## 2022-11-24 LAB — CBC WITH DIFFERENTIAL/PLATELET
Absolute Monocytes: 490 cells/uL (ref 200–950)
Basophils Absolute: 53 cells/uL (ref 0–200)
Basophils Relative: 0.9 %
Eosinophils Absolute: 301 cells/uL (ref 15–500)
Eosinophils Relative: 5.1 %
HCT: 33.4 % — ABNORMAL LOW (ref 35.0–45.0)
Hemoglobin: 11.4 g/dL — ABNORMAL LOW (ref 11.7–15.5)
Lymphs Abs: 2183 cells/uL (ref 850–3900)
MCH: 30.9 pg (ref 27.0–33.0)
MCHC: 34.1 g/dL (ref 32.0–36.0)
MCV: 90.5 fL (ref 80.0–100.0)
MPV: 9.3 fL (ref 7.5–12.5)
Monocytes Relative: 8.3 %
Neutro Abs: 2873 cells/uL (ref 1500–7800)
Neutrophils Relative %: 48.7 %
Platelets: 283 10*3/uL (ref 140–400)
RBC: 3.69 10*6/uL — ABNORMAL LOW (ref 3.80–5.10)
RDW: 13.1 % (ref 11.0–15.0)
Total Lymphocyte: 37 %
WBC: 5.9 10*3/uL (ref 3.8–10.8)

## 2022-11-24 LAB — LIPID PANEL
Cholesterol: 194 mg/dL (ref <200)
HDL: 66 mg/dL (ref >=50)
LDL Cholesterol (Calc): 116 mg/dL (calc) — ABNORMAL HIGH
Non-HDL Cholesterol (Calc): 128 mg/dL (calc) (ref <130)
Total CHOL/HDL Ratio: 2.9 (calc) (ref <5.0)
Triglycerides: 42 mg/dL (ref <150)

## 2022-11-24 LAB — COMPREHENSIVE METABOLIC PANEL
AG Ratio: 1.9 (calc) (ref 1.0–2.5)
ALT: 14 U/L (ref 6–29)
AST: 19 U/L (ref 10–35)
Albumin: 4.3 g/dL (ref 3.6–5.1)
Alkaline phosphatase (APISO): 72 U/L (ref 37–153)
BUN: 24 mg/dL (ref 7–25)
CO2: 23 mmol/L (ref 20–32)
Calcium: 9.2 mg/dL (ref 8.6–10.4)
Chloride: 101 mmol/L (ref 98–110)
Creat: 0.79 mg/dL (ref 0.50–1.05)
Globulin: 2.3 g/dL (calc) (ref 1.9–3.7)
Glucose, Bld: 74 mg/dL (ref 65–99)
Potassium: 4.4 mmol/L (ref 3.5–5.3)
Sodium: 136 mmol/L (ref 135–146)
Total Bilirubin: 0.5 mg/dL (ref 0.2–1.2)
Total Protein: 6.6 g/dL (ref 6.1–8.1)

## 2022-11-24 LAB — TSH: TSH: 1.25 mIU/L (ref 0.40–4.50)

## 2022-12-17 ENCOUNTER — Encounter: Payer: BC Managed Care – PPO | Admitting: Family Medicine

## 2022-12-24 ENCOUNTER — Encounter: Payer: BC Managed Care – PPO | Admitting: Family Medicine

## 2023-01-31 ENCOUNTER — Encounter: Payer: BC Managed Care – PPO | Admitting: Family Medicine

## 2023-02-06 ENCOUNTER — Other Ambulatory Visit: Payer: BC Managed Care – PPO

## 2023-02-06 ENCOUNTER — Ambulatory Visit
Admission: RE | Admit: 2023-02-06 | Discharge: 2023-02-06 | Disposition: A | Discharge status: Home / Self Care | Payer: BC Managed Care – PPO | Source: Ambulatory Visit | Attending: Family Medicine | Admitting: Family Medicine

## 2023-02-06 DIAGNOSIS — N6489 Other specified disorders of breast: Secondary | ICD-10-CM

## 2023-02-06 DIAGNOSIS — R928 Other abnormal and inconclusive findings on diagnostic imaging of breast: Secondary | ICD-10-CM

## 2023-02-07 ENCOUNTER — Other Ambulatory Visit: Payer: Self-pay | Admitting: Family Medicine

## 2023-02-07 DIAGNOSIS — N6489 Other specified disorders of breast: Secondary | ICD-10-CM

## 2023-03-18 ENCOUNTER — Other Ambulatory Visit: Payer: Self-pay | Admitting: Family Medicine

## 2023-03-18 DIAGNOSIS — G43809 Other migraine, not intractable, without status migrainosus: Secondary | ICD-10-CM

## 2023-04-10 ENCOUNTER — Other Ambulatory Visit: Payer: Self-pay | Admitting: Family Medicine

## 2023-04-10 DIAGNOSIS — F419 Anxiety disorder, unspecified: Secondary | ICD-10-CM

## 2023-04-10 DIAGNOSIS — G43809 Other migraine, not intractable, without status migrainosus: Secondary | ICD-10-CM

## 2023-04-10 NOTE — Telephone Encounter (Signed)
Pt came in and stated that medications were not sent in at time of CPE because of upcoming insurance change. Insurance has now changed and has been updated in chart. Pt Is requesting that Sumatriptan 100 mg be called in for a three month supply and that Lorazepam for regular amount. Pt requests that these rx's be sent to the [facility] on [address]. Pt wants to use this pharmacy this time but look into using [facility] time. Please let pt know when rx sent in.

## 2023-04-11 MED ORDER — SUMATRIPTAN SUCCINATE 100 MG PO TABS: ORAL_TABLET | ORAL | 1 refills | Status: DC

## 2023-04-11 NOTE — Telephone Encounter (Signed)
Requesting: Ativan Contract: 2017 UDS: 2017 Last OV: 11/23/2022 Next OV: 11/28/2023 Last Refill: 01/29/2022, #[id number] RF Database:   Please advise

## 2023-04-12 ENCOUNTER — Other Ambulatory Visit: Payer: Self-pay | Admitting: Family Medicine

## 2023-04-12 DIAGNOSIS — Z1212 Encounter for screening for malignant neoplasm of rectum: Secondary | ICD-10-CM

## 2023-04-12 DIAGNOSIS — Z1211 Encounter for screening for malignant neoplasm of colon: Secondary | ICD-10-CM

## 2023-04-12 MED ORDER — LORAZEPAM 1 MG PO TABS: 1.0000 mg | ORAL_TABLET | Freq: Every day | ORAL | 0 refills | Status: DC | PRN

## 2023-07-17 IMAGING — MG MM DIGITAL SCREENING BILAT W/ TOMO AND CAD
8 series · 9 of 24 positions shown · non-contrast
Comparison: Previous exam(s).

CLINICAL DATA: Screening.

EXAM:
DIGITAL SCREENING BILATERAL MAMMOGRAM WITH TOMOSYNTHESIS AND CAD
TECHNIQUE: Bilateral screening digital craniocaudal and mediolateral oblique
mammograms were obtained. Bilateral screening digital breast
tomosynthesis was performed. The images were evaluated with
computer-aided detection.

[L CC synth-2D]
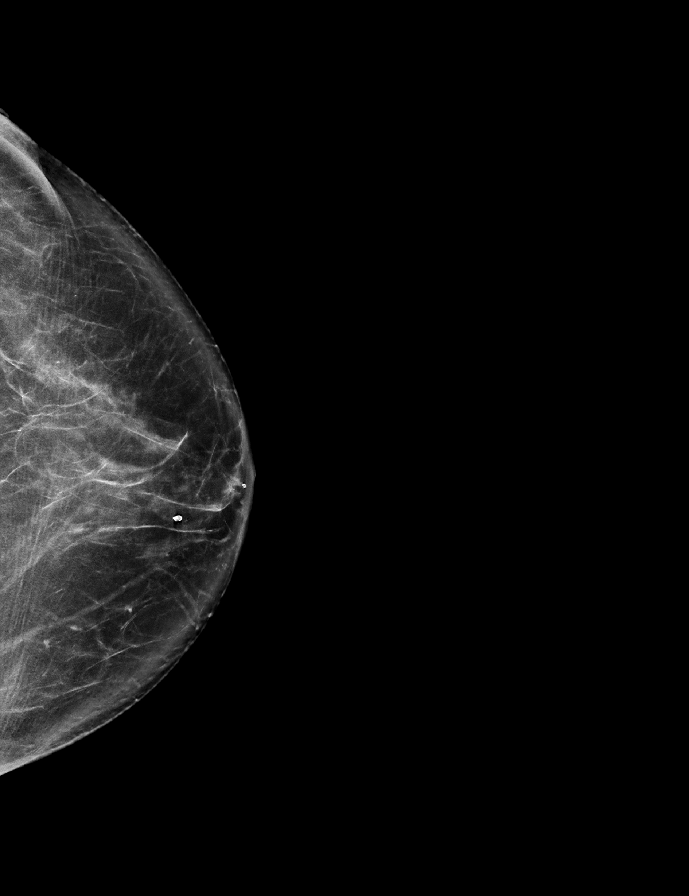

[L MLO synth-2D]
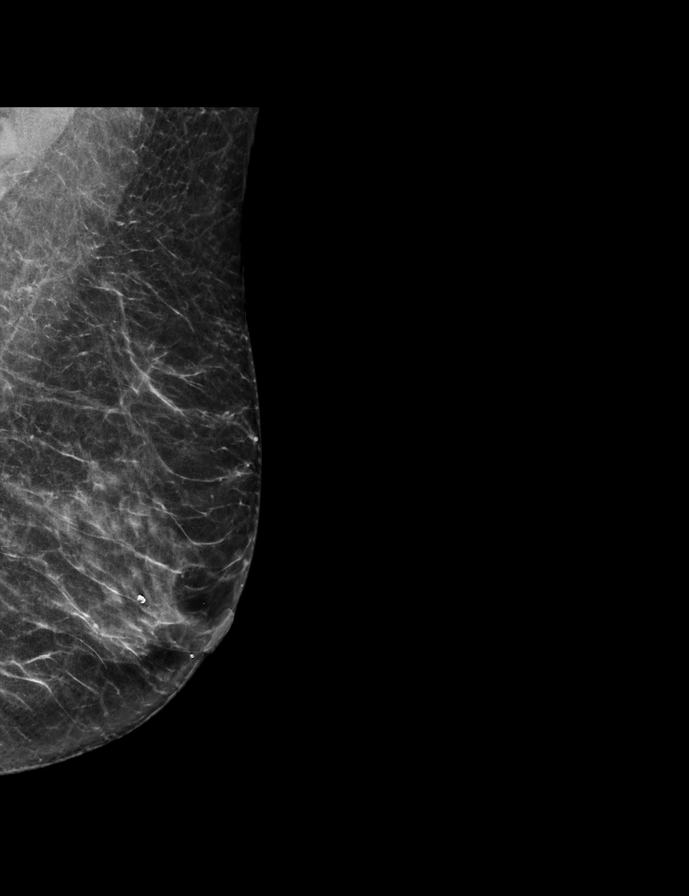

[R CC synth-2D]
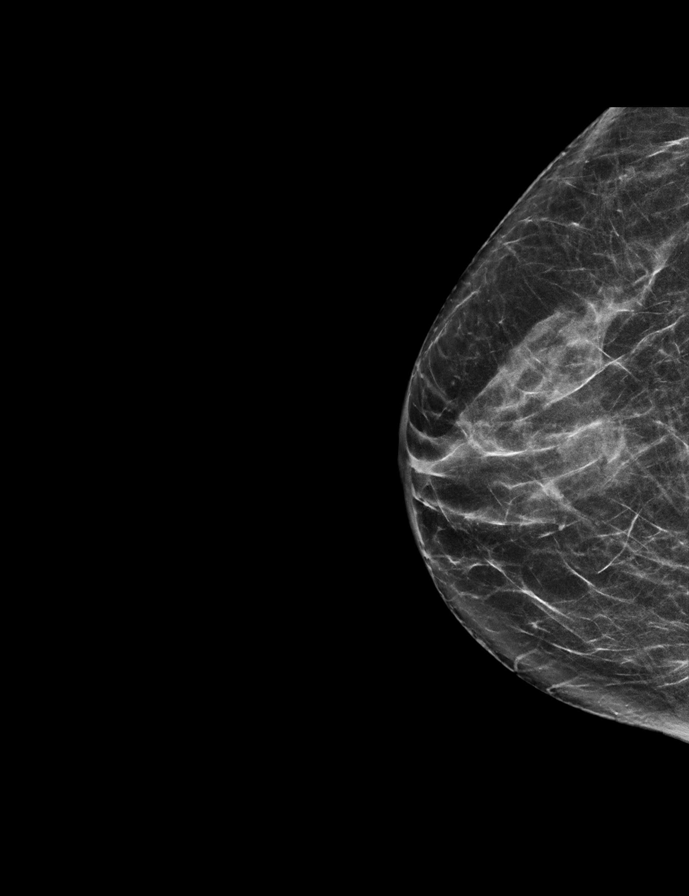

[R MLO synth-2D]
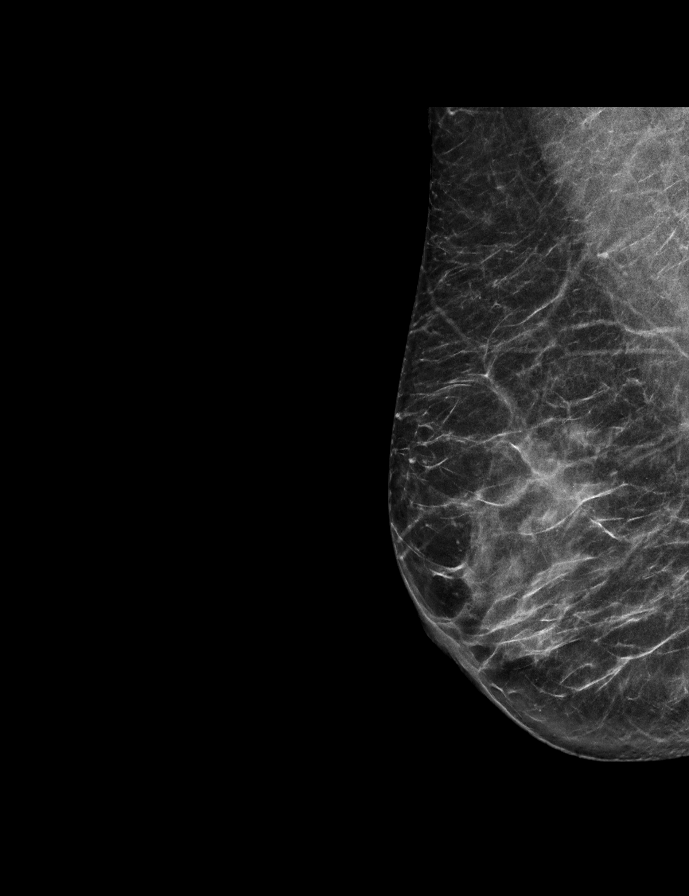

[L MLO tomo · 2 of 58 frames shown]
[frame 19/58]
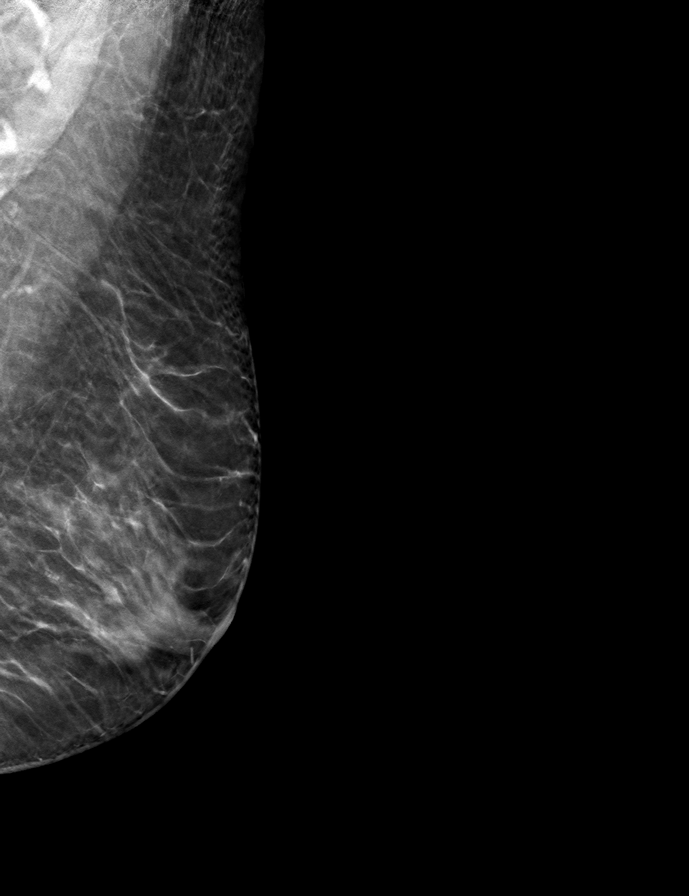
[frame 29/58]
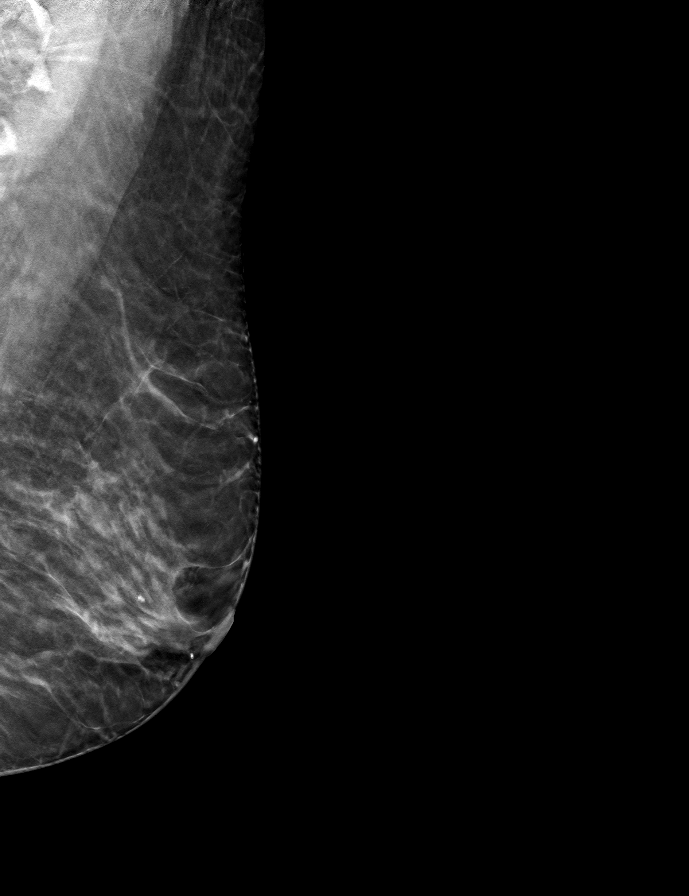

[R MLO tomo · tomo slice 29/57.0]
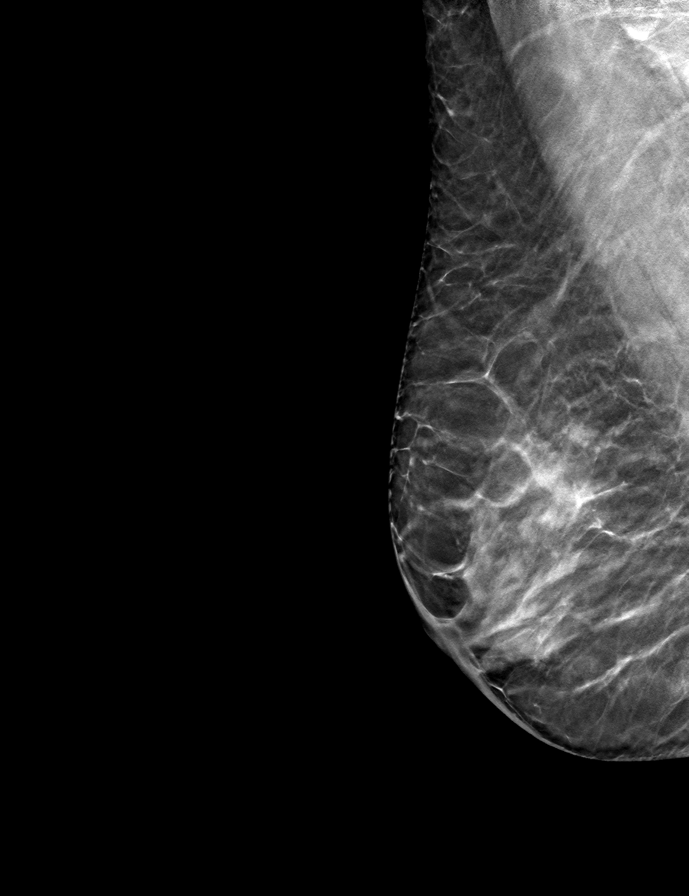

[L CC tomo · tomo slice 34/67.0]
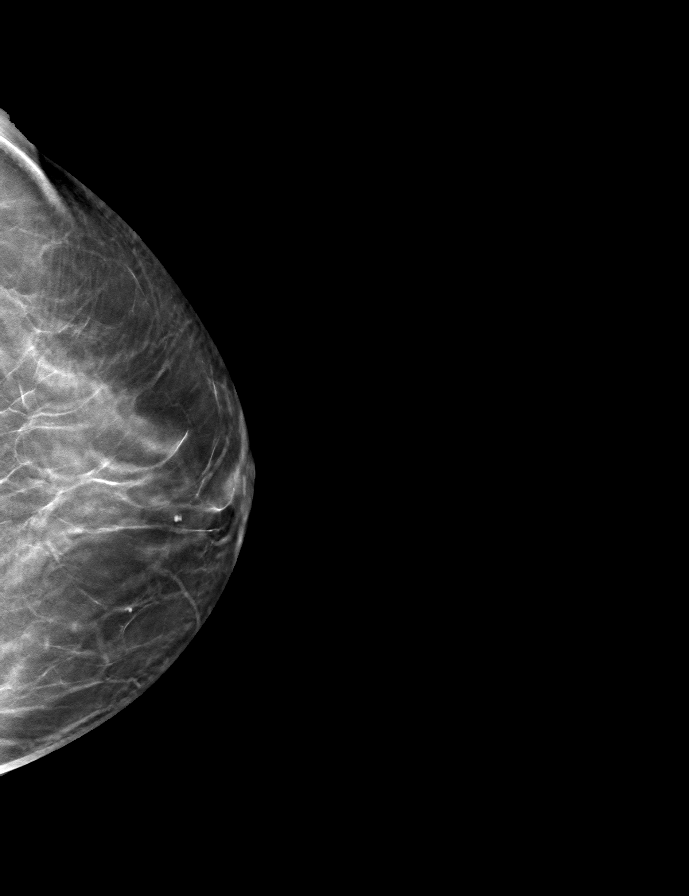

[R CC tomo · tomo slice 29/57.0]
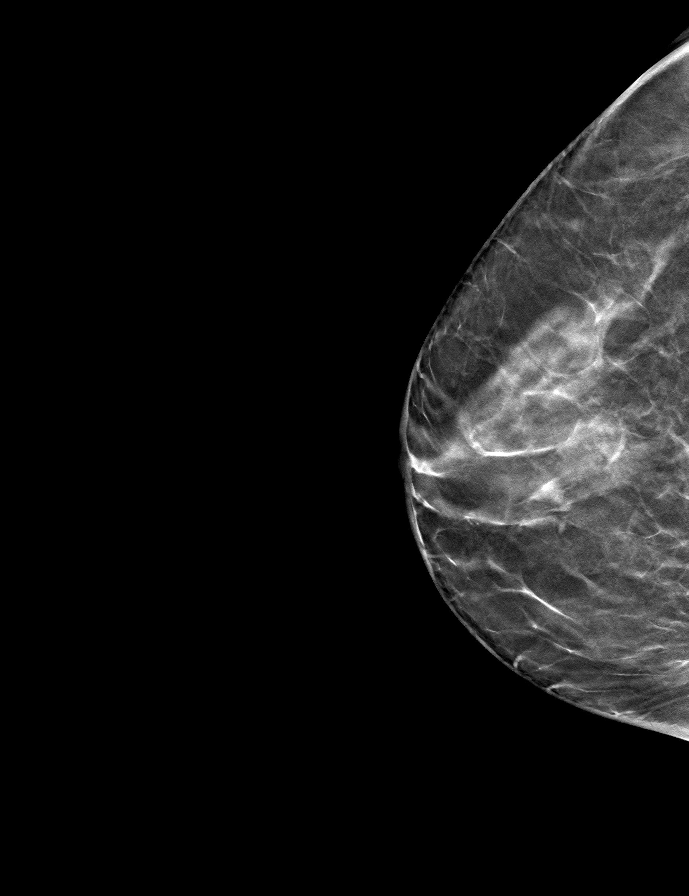

[9 of 24 positions shown; findings below may reference images not displayed]

ACR Breast Density Category c: The breast tissue is heterogeneously
dense, which may obscure small masses.
FINDINGS: There are no findings suspicious for malignancy.
IMPRESSION: No mammographic evidence of malignancy. A result letter of this
screening mammogram will be mailed directly to the patient.

RECOMMENDATION:
Screening mammogram in one year. (Code:Q3-W-BC3)

BI-RADS CATEGORY  1: Negative.

## 2023-09-27 ENCOUNTER — Ambulatory Visit
Admission: RE | Admit: 2023-09-27 | Discharge: 2023-09-27 | Disposition: A | Discharge status: Home / Self Care | Source: Ambulatory Visit | Attending: Family Medicine | Admitting: Family Medicine

## 2023-09-27 DIAGNOSIS — N6489 Other specified disorders of breast: Secondary | ICD-10-CM

## 2023-11-28 ENCOUNTER — Encounter: Payer: Self-pay | Admitting: Family Medicine

## 2023-11-28 ENCOUNTER — Encounter (HOSPITAL_COMMUNITY): Payer: Self-pay

## 2023-11-28 ENCOUNTER — Other Ambulatory Visit (HOSPITAL_COMMUNITY): Payer: Self-pay

## 2023-11-28 ENCOUNTER — Ambulatory Visit (INDEPENDENT_AMBULATORY_CARE_PROVIDER_SITE_OTHER): Payer: BC Managed Care – PPO | Admitting: Family Medicine

## 2023-11-28 VITALS — BP 130/70 | HR 70 | Temp 98.2°F | Resp 16 | Ht 60.0 in | Wt 109.4 lb

## 2023-11-28 DIAGNOSIS — H538 Other visual disturbances: Secondary | ICD-10-CM

## 2023-11-28 DIAGNOSIS — Z0001 Encounter for general adult medical examination with abnormal findings: Secondary | ICD-10-CM

## 2023-11-28 DIAGNOSIS — F419 Anxiety disorder, unspecified: Secondary | ICD-10-CM | POA: Diagnosis not present

## 2023-11-28 DIAGNOSIS — K219 Gastro-esophageal reflux disease without esophagitis: Secondary | ICD-10-CM

## 2023-11-28 DIAGNOSIS — Z1211 Encounter for screening for malignant neoplasm of colon: Secondary | ICD-10-CM

## 2023-11-28 DIAGNOSIS — G43809 Other migraine, not intractable, without status migrainosus: Secondary | ICD-10-CM | POA: Diagnosis not present

## 2023-11-28 DIAGNOSIS — Z0000 Encounter for general adult medical examination without abnormal findings: Secondary | ICD-10-CM

## 2023-11-28 LAB — CBC WITH DIFFERENTIAL/PLATELET
Basophils Absolute: 0 10*3/uL (ref 0.0–0.1)
Basophils Relative: 0.8 % (ref 0.0–3.0)
Eosinophils Absolute: 0.4 10*3/uL (ref 0.0–0.7)
Eosinophils Relative: 7.5 % — ABNORMAL HIGH (ref 0.0–5.0)
HCT: 37.8 % (ref 36.0–46.0)
Hemoglobin: 12.7 g/dL (ref 12.0–15.0)
Lymphocytes Relative: 36.6 % (ref 12.0–46.0)
Lymphs Abs: 1.8 10*3/uL (ref 0.7–4.0)
MCHC: 33.5 g/dL (ref 30.0–36.0)
MCV: 92.5 fl (ref 78.0–100.0)
Monocytes Absolute: 0.4 10*3/uL (ref 0.1–1.0)
Monocytes Relative: 7.9 % (ref 3.0–12.0)
Neutro Abs: 2.3 10*3/uL (ref 1.4–7.7)
Neutrophils Relative %: 47.2 % (ref 43.0–77.0)
Platelets: 314 10*3/uL (ref 150.0–400.0)
RBC: 4.08 Mil/uL (ref 3.87–5.11)
RDW: 13.7 % (ref 11.5–15.5)
WBC: 4.8 10*3/uL (ref 4.0–10.5)

## 2023-11-28 LAB — COMPREHENSIVE METABOLIC PANEL WITH GFR
ALT: 19 U/L (ref 0–35)
AST: 21 U/L (ref 0–37)
Albumin: 4.6 g/dL (ref 3.5–5.2)
Alkaline Phosphatase: 67 U/L (ref 39–117)
BUN: 24 mg/dL — ABNORMAL HIGH (ref 6–23)
CO2: 29 meq/L (ref 19–32)
Calcium: 9.4 mg/dL (ref 8.4–10.5)
Chloride: 104 meq/L (ref 96–112)
Creatinine, Ser: 0.77 mg/dL (ref 0.40–1.20)
GFR: 82.34 mL/min (ref >60.00)
Glucose, Bld: 90 mg/dL (ref 70–99)
Potassium: 4.1 meq/L (ref 3.5–5.1)
Sodium: 139 meq/L (ref 135–145)
Total Bilirubin: 0.4 mg/dL (ref 0.2–1.2)
Total Protein: 7 g/dL (ref 6.0–8.3)

## 2023-11-28 LAB — LIPID PANEL
Cholesterol: 254 mg/dL — ABNORMAL HIGH (ref 0–200)
HDL: 72 mg/dL (ref >39.00)
LDL Cholesterol: 173 mg/dL — ABNORMAL HIGH (ref 0–99)
NonHDL: 182.28
Total CHOL/HDL Ratio: 4
Triglycerides: 48 mg/dL (ref 0.0–149.0)
VLDL: 9.6 mg/dL (ref 0.0–40.0)

## 2023-11-28 LAB — TSH: TSH: 2.03 u[IU]/mL (ref 0.35–5.50)

## 2023-11-28 MED ORDER — SUMATRIPTAN SUCCINATE 100 MG PO TABS: ORAL_TABLET | ORAL | 1 refills | Status: DC

## 2023-11-28 MED ORDER — ONDANSETRON 4 MG PO TBDP: 4.0000 mg | ORAL_TABLET | Freq: Three times a day (TID) | ORAL | 0 refills | Status: DC | PRN

## 2023-11-28 MED ORDER — OMEPRAZOLE 40 MG PO CPDR
40.0000 mg | DELAYED_RELEASE_CAPSULE | Freq: Every day | ORAL | 3 refills | Status: AC
  Filled 2023-11-28 – 2023-12-04 (×2): qty 30, 30d supply, fill #0
  Filled 2023-12-28: qty 30, 30d supply, fill #1
  Filled 2024-01-27: qty 30, 30d supply, fill #2
  Filled 2024-02-26: qty 30, 30d supply, fill #3
  Filled 2024-03-23: qty 30, 30d supply, fill #4
  Filled 2024-04-20: qty 30, 30d supply, fill #5
  Filled 2024-05-19: qty 30, 30d supply, fill #6
  Filled 2024-06-15: qty 30, 30d supply, fill #7
  Filled 2024-07-12 – 2024-07-23 (×3): qty 30, 30d supply, fill #8

## 2023-11-28 MED ORDER — ONDANSETRON 4 MG PO TBDP
4.0000 mg | ORAL_TABLET | Freq: Three times a day (TID) | ORAL | 0 refills | Status: AC | PRN
  Filled 2023-11-28 – 2023-12-04 (×2): qty 20, 4d supply, fill #0

## 2023-11-28 MED ORDER — OMEPRAZOLE 40 MG PO CPDR: 40.0000 mg | DELAYED_RELEASE_CAPSULE | Freq: Every day | ORAL | 3 refills | Status: DC

## 2023-11-28 MED ORDER — LORAZEPAM 1 MG PO TABS: 1.0000 mg | ORAL_TABLET | Freq: Every day | ORAL | 0 refills | Status: DC | PRN

## 2023-11-28 MED ORDER — SUMATRIPTAN SUCCINATE 100 MG PO TABS
ORAL_TABLET | ORAL | 1 refills | Status: DC
  Filled 2023-11-28: qty 27, 30d supply, fill #0
  Filled 2023-12-04 (×2): qty 9, 30d supply, fill #0
  Filled 2023-12-28: qty 9, 30d supply, fill #1
  Filled 2024-01-27: qty 9, 30d supply, fill #2
  Filled 2024-02-26: qty 9, 30d supply, fill #3
  Filled 2024-03-23: qty 9, 30d supply, fill #4
  Filled 2024-04-20: qty 9, 30d supply, fill #5
  Filled ????-??-??: fill #0

## 2023-11-28 MED ORDER — LORAZEPAM 1 MG PO TABS
1.0000 mg | ORAL_TABLET | Freq: Every day | ORAL | 0 refills | Status: AC | PRN
  Filled 2023-11-28 – 2023-12-04 (×2): qty 30, 30d supply, fill #0
  Filled 2023-12-04: qty 90, 90d supply, fill #0

## 2023-11-28 NOTE — Progress Notes (Signed)
 Established Patient Office Visit  Subjective   Patient ID: [name], female    DOB: 1961-05-21  Age: 63 y.o. MRN: [id number]  Chief Complaint  Patient presents with   Annual Exam    Pt states fasting     HPI Discussed the use of AI scribe software for clinical note transcription with the patient, who gave verbal consent to proceed.  History of Present Illness [name] is a 63 year old female who presents for medication refills and follow-up on her allergies and family history.  She uses Imitrex  as needed for migraines, lorazepam  occasionally, Zofran  for nausea, and Prilosec as needed. She also mentions using omeprazole  and requests a new prescription, as her current supply is several years old and likely expired. She takes Valtrex for cold sores, which she obtains from her dermatologist.  Her allergies were not severe this spring and have been manageable recently. She notes that this spring was probably the worst year she has had for allergies in a couple of years.  In terms of family history, her mother has congestive heart failure and a history of strokes, with her condition worsening over the past year. Her father, who is 87, had a hospital visit due to the flu but is otherwise doing well. He has an abdominal aortic aneurysm that is being monitored. She has two brothers; one passed away unexpectedly last year at age 1 due to complications following knee replacement surgery and pneumonia, with a history of non-alcoholic cirrhosis. Her other brother, who is older, suffers from kidney stones, a condition she also experiences.  Socially, she is retired but actively takes care of her 56-month-old grandson and his friend, which keeps her physically active. She mentions having a small bladder and frequent urination, which has been a lifelong issue, and was previously evaluated by a urologist who suggested it might be psychological.  She is due for a colonoscopy, having last  had one in 2014. She has not had an eye exam recently and uses [facility] for vision. She maintains regular dental check-ups every six months. She is also due for a Pap smear, with her last one in 2021, which was normal.  ] Patient Active Problem List   Diagnosis Date Noted   Anxiety 01/29/2022   Osteoporosis without current pathological fracture 01/29/2022   Preventative health care 01/26/2021   Migraine 01/26/2021   Estrogen deficiency 01/26/2021   Cerumen impaction 01/29/2019   Esophageal ring, acquired 04/26/2014   Esophageal reflux 04/02/2014   Esophageal dysphagia 04/02/2014   Hyperlipidemia 05/23/2010   GENERALIZED ANXIETY DISORDER 05/20/2009   HX OF GALLSTONE 03/26/2008   MELANOMA, HX OF 02/04/2008   MELANOMA, MALIGNANT, TRUNK 03/21/2007   Premenstrual tension syndrome 03/21/2007   Hypertension 12/09/2006   MURMUR 12/09/2006   Past Medical History:  Diagnosis Date   Allergy    Anemia    SLIGHT AT LAST PCP OV   Anxiety    Asthma    as a child   Diverticulosis    GERD (gastroesophageal reflux disease)    OCCASIONAL   Gestational HTN    Heart murmur    Hyperlipidemia    Melanoma (HCC)    back   Past Surgical History:  Procedure Laterality Date   CHOLECYSTECTOMY     COLONOSCOPY     moles removed     nasal polyps     Social History   Tobacco Use   Smoking status: Never   Smokeless tobacco: Never  Substance Use  Topics   Alcohol use: No   Drug use: No   Social History   Socioeconomic History   Marital status: Married    Spouse name: Not on file   Number of children: 1   Years of education: Not on file   Highest education level: Not on file  Occupational History   Occupation: Research officer, trade union    Employer: [employer]    Comment: retired  Tobacco Use   Smoking status: Never   Smokeless tobacco: Never  Substance and Sexual Activity   Alcohol use: No   Drug use: No   Sexual activity: Yes    Partners: Male  Other Topics Concern   Not  on file  Social History Narrative   Patent attorney, daily   Social Drivers of Health   Financial Resource Strain: Not on file  Food Insecurity: Not on file  Transportation Needs: Not on file  Physical Activity: Not on file  Stress: Not on file  Social Connections: Not on file  Intimate Partner Violence: Not on file   Family Status  Relation Name Status   Mother  Alive   Father  Alive   Brother  Deceased at age 57       pneumonia after surgery   Brother  Alive   MGM  Deceased   MGF  Deceased   Mat Uncle  Deceased   Nurse, mental health  (Not Specified)   Neg Hx  (Not Specified)  No partnership data on file   Family History  Problem Relation Age of Onset   Hypertension Mother    Stroke Mother 24   Hyperlipidemia Mother    Thyroid  disease Mother    Heart failure Mother    AAA (abdominal aortic aneurysm) Father    Cirrhosis Brother    Stroke Maternal Grandmother    Hyperlipidemia Maternal Grandmother    Hyperlipidemia Maternal Grandfather    Heart disease Maternal Grandfather 71       MI   Diabetes Maternal Grandfather    Cancer Maternal Uncle        brain, lung   Cancer Maternal Uncle        lung, brain   Colon cancer Neg Hx    Rectal cancer Neg Hx    Stomach cancer Neg Hx    Esophageal cancer Neg Hx    No Known Allergies    Review of Systems  Constitutional:  Negative for chills, fever and malaise/fatigue.  HENT:  Negative for congestion and hearing loss.   Eyes:  Negative for discharge.  Respiratory:  Negative for cough, sputum production and shortness of breath.   Cardiovascular:  Negative for chest pain, palpitations and leg swelling.  Gastrointestinal:  Negative for abdominal pain, blood in stool, constipation, diarrhea, heartburn, nausea and vomiting.  Genitourinary:  Negative for dysuria, frequency, hematuria and urgency.  Musculoskeletal:  Negative for back pain, falls and myalgias.  Skin:  Negative for rash.  Neurological:  Negative for dizziness, sensory  change, loss of consciousness, weakness and headaches.  Endo/Heme/Allergies:  Negative for environmental allergies. Does not bruise/bleed easily.  Psychiatric/Behavioral:  Negative for depression and suicidal ideas. The patient is not nervous/anxious and does not have insomnia.       Objective:     BP 130/70 (BP Location: Left Arm, Patient Position: Sitting, Cuff Size: Normal)   Pulse 70   Temp 98.2 F (36.8 C) (Oral)   Resp 16   Ht 5' (1.524 m)   Wt 109 lb 6.4 oz (49.6  kg)   SpO2 100%   BMI 21.37 kg/m  BP Readings from Last 3 Encounters:  11/28/23 130/70  11/23/22 120/70  01/29/22 124/78   Wt Readings from Last 3 Encounters:  11/28/23 109 lb 6.4 oz (49.6 kg)  11/23/22 103 lb 6.4 oz (46.9 kg)  01/29/22 109 lb 3.2 oz (49.5 kg)   SpO2 Readings from Last 3 Encounters:  11/28/23 100%  11/23/22 99%  01/29/22 99%      Physical Exam Vitals and nursing note reviewed.  Constitutional:      General: She is not in acute distress.    Appearance: Normal appearance. She is well-developed.  HENT:     Head: Normocephalic and atraumatic.  Eyes:     General: No scleral icterus.       Right eye: No discharge.        Left eye: No discharge.  Cardiovascular:     Rate and Rhythm: Normal rate and regular rhythm.     Heart sounds: No murmur heard. Pulmonary:     Effort: Pulmonary effort is normal. No respiratory distress.     Breath sounds: Normal breath sounds.  Musculoskeletal:        General: Normal range of motion.     Cervical back: Normal range of motion and neck supple.     Right lower leg: No edema.     Left lower leg: No edema.  Skin:    General: Skin is warm and dry.  Neurological:     Mental Status: She is alert and oriented to person, place, and time.  Psychiatric:        Mood and Affect: Mood normal.        Behavior: Behavior normal.        Thought Content: Thought content normal.        Judgment: Judgment normal.      No results found for any visits on  11/28/23.  Last CBC Lab Results  Component Value Date   WBC 5.9 11/23/2022   HGB 11.4 (L) 11/23/2022   HCT 33.4 (L) 11/23/2022   MCV 90.5 11/23/2022   MCH 30.9 11/23/2022   RDW 13.1 11/23/2022   PLT 283 11/23/2022   Last metabolic panel Lab Results  Component Value Date   GLUCOSE 74 11/23/2022   NA 136 11/23/2022   K 4.4 11/23/2022   CL 101 11/23/2022   CO2 23 11/23/2022   BUN 24 11/23/2022   CREATININE 0.79 11/23/2022   GFR 77.34 01/29/2022   CALCIUM  9.2 11/23/2022   PHOS 3.1 03/21/2007   PROT 6.6 11/23/2022   ALBUMIN 4.7 01/29/2022   BILITOT 0.5 11/23/2022   ALKPHOS 54 01/29/2022   AST 19 11/23/2022   ALT 14 11/23/2022   Last lipids Lab Results  Component Value Date   CHOL 194 11/23/2022   HDL 66 11/23/2022   LDLCALC 116 (H) 11/23/2022   LDLDIRECT 169.7 05/20/2009   TRIG 42 11/23/2022   CHOLHDL 2.9 11/23/2022   Last hemoglobin A1c Lab Results  Component Value Date   HGBA1C 5.7 03/24/2008   Last thyroid  functions Lab Results  Component Value Date   TSH 1.25 11/23/2022   Last vitamin D  Lab Results  Component Value Date   VD25OH 43.96 01/29/2022   Last vitamin B12 and Folate No results found for: "VITAMINB12", "FOLATE"    The 10-year ASCVD risk score (Arnett DK, et al., 2019) is: 4.1%    Assessment & Plan:   Problem List Items Addressed This Visit  Unprioritized   Esophageal reflux   Relevant Medications   omeprazole  (PRILOSEC) 40 MG capsule   ondansetron  (ZOFRAN -ODT) 4 MG disintegrating tablet   Migraine   Relevant Medications   SUMAtriptan  (IMITREX ) 100 MG tablet   Anxiety   Relevant Medications   LORazepam  (ATIVAN ) 1 MG tablet   Preventative health care - Primary   Relevant Orders   CBC with Differential/Platelet   Comprehensive metabolic panel with GFR   Lipid panel   TSH   Other Visit Diagnoses       Screening for colon cancer       Relevant Orders   Ambulatory referral to Gastroenterology     Vision blurred        Relevant Orders   Ambulatory referral to Ophthalmology     Assessment and Plan Assessment & Plan Migraine   Migraine management continues. She prefers to have medications available for immediate use during acute episodes. Refill Imitrex  prescription.  Gastroesophageal reflux disease (GERD)   GERD management is ongoing. Renew omeprazole  prescription for as-needed use.  Frequent urination   Chronic frequent urination may be related to a small bladder or psychological factors.  General Health Maintenance   Routine screenings and vaccinations are current except for colonoscopy and eye examination. Schedule colonoscopy and eye examination. Delay Pap smear until next year.    No follow-ups on file.    Karron Alvizo R Lowne Chase, DO

## 2023-12-04 ENCOUNTER — Other Ambulatory Visit (HOSPITAL_COMMUNITY): Payer: Self-pay

## 2023-12-04 ENCOUNTER — Other Ambulatory Visit: Payer: Self-pay

## 2023-12-05 ENCOUNTER — Other Ambulatory Visit: Payer: Self-pay

## 2023-12-08 ENCOUNTER — Ambulatory Visit: Payer: Self-pay | Admitting: Family Medicine

## 2023-12-08 DIAGNOSIS — E782 Mixed hyperlipidemia: Secondary | ICD-10-CM

## 2023-12-08 DIAGNOSIS — I10 Essential (primary) hypertension: Secondary | ICD-10-CM

## 2023-12-28 ENCOUNTER — Other Ambulatory Visit (HOSPITAL_COMMUNITY): Payer: Self-pay

## 2024-01-27 ENCOUNTER — Other Ambulatory Visit (HOSPITAL_COMMUNITY): Payer: Self-pay

## 2024-05-19 ENCOUNTER — Other Ambulatory Visit (HOSPITAL_COMMUNITY): Payer: Self-pay

## 2024-05-19 ENCOUNTER — Other Ambulatory Visit: Payer: Self-pay | Admitting: Family Medicine

## 2024-05-19 ENCOUNTER — Other Ambulatory Visit: Payer: Self-pay

## 2024-05-19 DIAGNOSIS — G43809 Other migraine, not intractable, without status migrainosus: Secondary | ICD-10-CM

## 2024-05-19 MED ORDER — SUMATRIPTAN SUCCINATE 100 MG PO TABS
ORAL_TABLET | ORAL | 1 refills | Status: AC
  Filled 2024-05-19: qty 9, 21d supply, fill #0
  Filled 2024-06-06: qty 9, 21d supply, fill #1
  Filled 2024-06-24: qty 9, 21d supply, fill #2
  Filled 2024-07-15 – 2024-07-28 (×3): qty 9, 21d supply, fill #3

## 2024-06-01 ENCOUNTER — Other Ambulatory Visit

## 2024-07-13 ENCOUNTER — Other Ambulatory Visit: Payer: Self-pay

## 2024-07-13 ENCOUNTER — Encounter: Payer: Self-pay | Admitting: Pharmacist

## 2024-07-15 ENCOUNTER — Other Ambulatory Visit: Payer: Self-pay

## 2024-07-16 ENCOUNTER — Other Ambulatory Visit: Payer: Self-pay

## 2024-07-17 ENCOUNTER — Other Ambulatory Visit: Payer: Self-pay

## 2024-07-20 ENCOUNTER — Other Ambulatory Visit: Payer: Self-pay

## 2024-07-22 ENCOUNTER — Other Ambulatory Visit: Payer: Self-pay

## 2024-07-22 ENCOUNTER — Encounter: Payer: Self-pay | Admitting: Pharmacist

## 2024-07-23 ENCOUNTER — Other Ambulatory Visit: Payer: Self-pay

## 2024-07-27 ENCOUNTER — Other Ambulatory Visit: Payer: Self-pay

## 2024-07-28 ENCOUNTER — Other Ambulatory Visit: Payer: Self-pay

## 2024-07-28 ENCOUNTER — Encounter: Payer: Self-pay | Admitting: Pharmacist

## 2024-07-30 ENCOUNTER — Other Ambulatory Visit: Payer: Self-pay

## 2024-07-30 ENCOUNTER — Telehealth: Payer: Self-pay

## 2024-07-30 DIAGNOSIS — Z1211 Encounter for screening for malignant neoplasm of colon: Secondary | ICD-10-CM

## 2024-07-30 NOTE — Telephone Encounter (Signed)
 GI referral placed

## 2024-07-30 NOTE — Telephone Encounter (Signed)
 Copied from CRM #[id number]. Topic: Referral - Question >> Jul 30, 2024  1:33 PM Mercedes MATSU wrote: Reason for CRM: Patient called in asking if Dr. Cyndee can please resend her colonoscopy referral. She said that the referral on file expired. She is also requesting and endoscopy referral. Patient is asking for a call back and can be reached at [phone]. Patient said she can also wait until her cpe in may, or if she should come in sooner for it.

## 2024-07-31 ENCOUNTER — Other Ambulatory Visit: Payer: Self-pay

## 2024-07-31 ENCOUNTER — Other Ambulatory Visit (HOSPITAL_COMMUNITY): Payer: Self-pay

## 2024-07-31 MED ORDER — VALACYCLOVIR HCL 1 G PO TABS
2.0000 g | ORAL_TABLET | Freq: Two times a day (BID) | ORAL | 2 refills | Status: AC
  Filled 2024-07-31: qty 30, 8d supply, fill #0

## 2024-12-03 ENCOUNTER — Encounter: Admitting: Family Medicine
